# Patient Record
Sex: Male | Born: 1965 | Race: Black or African American | Hispanic: No | Marital: Single | Smoking: Never smoker
Health system: Southern US, Community
[De-identification: ages and names within clinical notes are randomized; demographics above are authoritative.]

## PROBLEM LIST (undated history)

## (undated) DIAGNOSIS — E119 Type 2 diabetes mellitus without complications: Secondary | ICD-10-CM

## (undated) DIAGNOSIS — I10 Essential (primary) hypertension: Secondary | ICD-10-CM

## (undated) DIAGNOSIS — R000 Tachycardia, unspecified: Secondary | ICD-10-CM

## (undated) DIAGNOSIS — R0602 Shortness of breath: Secondary | ICD-10-CM

## (undated) HISTORY — DX: Tachycardia, unspecified: R00.0

## (undated) HISTORY — DX: Essential (primary) hypertension: I10

## (undated) HISTORY — DX: Shortness of breath: R06.02

---

## 2007-05-18 ENCOUNTER — Ambulatory Visit: Payer: Self-pay | Admitting: Emergency Medicine

## 2007-08-21 ENCOUNTER — Ambulatory Visit: Payer: Self-pay | Admitting: Internal Medicine

## 2007-09-17 ENCOUNTER — Ambulatory Visit: Payer: Self-pay | Admitting: Emergency Medicine

## 2007-10-10 ENCOUNTER — Ambulatory Visit: Payer: Self-pay | Admitting: Family Medicine

## 2007-12-28 ENCOUNTER — Ambulatory Visit: Payer: Self-pay | Admitting: Family Medicine

## 2008-05-06 ENCOUNTER — Ambulatory Visit: Payer: Self-pay | Admitting: Family Medicine

## 2008-05-14 ENCOUNTER — Ambulatory Visit: Payer: Self-pay | Admitting: Family Medicine

## 2008-06-18 ENCOUNTER — Ambulatory Visit: Payer: Self-pay | Admitting: Family Medicine

## 2008-08-28 ENCOUNTER — Ambulatory Visit: Payer: Self-pay | Admitting: Family Medicine

## 2008-11-10 ENCOUNTER — Ambulatory Visit: Payer: Self-pay | Admitting: Internal Medicine

## 2008-12-02 ENCOUNTER — Ambulatory Visit: Payer: Self-pay | Admitting: Internal Medicine

## 2009-03-21 ENCOUNTER — Ambulatory Visit: Payer: Self-pay | Admitting: Internal Medicine

## 2009-06-03 ENCOUNTER — Ambulatory Visit: Payer: Self-pay | Admitting: Family Medicine

## 2009-08-03 ENCOUNTER — Ambulatory Visit: Payer: Self-pay | Admitting: Family Medicine

## 2010-02-21 ENCOUNTER — Emergency Department: Payer: Self-pay | Admitting: Emergency Medicine

## 2010-02-26 ENCOUNTER — Encounter: Payer: Self-pay | Admitting: Cardiovascular Disease

## 2010-03-05 ENCOUNTER — Ambulatory Visit: Payer: Self-pay | Admitting: Cardiovascular Disease

## 2010-03-05 DIAGNOSIS — R0989 Other specified symptoms and signs involving the circulatory and respiratory systems: Secondary | ICD-10-CM | POA: Insufficient documentation

## 2010-03-05 DIAGNOSIS — R0602 Shortness of breath: Secondary | ICD-10-CM | POA: Insufficient documentation

## 2010-03-05 DIAGNOSIS — I10 Essential (primary) hypertension: Secondary | ICD-10-CM | POA: Insufficient documentation

## 2010-03-05 DIAGNOSIS — R079 Chest pain, unspecified: Secondary | ICD-10-CM | POA: Insufficient documentation

## 2010-03-05 DIAGNOSIS — I498 Other specified cardiac arrhythmias: Secondary | ICD-10-CM

## 2010-03-09 ENCOUNTER — Telehealth: Payer: Self-pay | Admitting: Cardiovascular Disease

## 2010-03-15 ENCOUNTER — Ambulatory Visit: Payer: Self-pay | Admitting: Internal Medicine

## 2010-03-15 ENCOUNTER — Encounter: Payer: Self-pay | Admitting: Cardiovascular Disease

## 2010-03-16 ENCOUNTER — Ambulatory Visit: Payer: Self-pay

## 2010-03-16 ENCOUNTER — Encounter: Payer: Self-pay | Admitting: Internal Medicine

## 2010-03-16 ENCOUNTER — Encounter: Payer: Self-pay | Admitting: Cardiovascular Disease

## 2010-03-16 LAB — CONVERTED CEMR LAB
ALT: 79 units/L — ABNORMAL HIGH (ref 0–53)
Albumin: 5.1 g/dL (ref 3.5–5.2)
TSH: 1.766 microintl units/mL (ref 0.350–4.500)
Total Protein: 8.1 g/dL (ref 6.0–8.3)

## 2010-03-19 ENCOUNTER — Encounter: Payer: Self-pay | Admitting: Cardiovascular Disease

## 2010-03-19 ENCOUNTER — Ambulatory Visit: Payer: Self-pay | Admitting: Internal Medicine

## 2010-03-22 LAB — CONVERTED CEMR LAB
BUN: 11 mg/dL (ref 6–23)
Eosinophils Absolute: 0.2 10*3/uL (ref 0.0–0.7)
INR: 1.11 (ref <1.50)
Lymphocytes Relative: 21 % (ref 12–46)
Lymphs Abs: 1.8 10*3/uL (ref 0.7–4.0)
MCV: 94.8 fL (ref 78.0–100.0)
Neutro Abs: 6.3 10*3/uL (ref 1.7–7.7)
Neutrophils Relative %: 72 % (ref 43–77)
Platelets: 180 10*3/uL (ref 150–400)
Potassium: 4.1 meq/L (ref 3.5–5.3)
Prothrombin Time: 14.2 s (ref 11.6–15.2)
WBC: 8.7 10*3/uL (ref 4.0–10.5)
aPTT: 31 s

## 2010-03-24 ENCOUNTER — Ambulatory Visit: Payer: Self-pay | Admitting: Cardiovascular Disease

## 2010-04-12 ENCOUNTER — Emergency Department: Payer: Self-pay | Admitting: Emergency Medicine

## 2010-05-14 ENCOUNTER — Ambulatory Visit: Payer: Self-pay | Admitting: Orthopedic Surgery

## 2010-07-01 ENCOUNTER — Encounter: Payer: Self-pay | Admitting: Cardiovascular Disease

## 2010-07-31 ENCOUNTER — Encounter: Payer: Self-pay | Admitting: Cardiovascular Disease

## 2010-09-28 NOTE — Cardiovascular Report (Signed)
Summary: Cardiac Cath Other  Cardiac Cath Other   Imported By: West Carbo 03/25/2010 11:02:03  _____________________________________________________________________  External Attachment:    Type:   Image     Comment:   External Document

## 2010-09-28 NOTE — Letter (Signed)
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 [address]   [place], [place] [address]   Phone: [phone]  Fax: [phone]    03/19/2010  TO: Leodis Sias IT MAY CONCERN   RE: [place] P[place]TA [address] [id number]   The above named individual is under my medical care and may return to work after his procedure on July 27,2011: August 1,2011.  If you have any further questions or need additional information, please call.     Sincerely,      Tim Gollan,MD/Ph.D

## 2010-09-28 NOTE — Letter (Signed)
Summary: Disability  Disability   Imported By: Harlon Flor 08/06/2010 15:41:15  _____________________________________________________________________  External Attachment:    Type:   Image     Comment:   External Document

## 2010-09-28 NOTE — Progress Notes (Signed)
Summary: Holter monitor  Phone Note Outgoing Call   Call placed by: Benedict Needy, RN,  March 09, 2010 3:34 PM Call placed to: Patient Summary of Call: Pt had holter placed on 03/05/10 supposed to bring back on 03/08/10.  Attempted to call several times since monday pt has not returned any of the phone calls.  Pt has appointment with Dr. Ladona Ridgel 03/10/10. Dr. Mariah Milling aware that pt is not returning phone calls.  Initial call taken by: Benedict Needy, RN,  March 09, 2010 3:36 PM  Follow-up for Phone Call        The pt came in today for his office visit with Dr. Ladona Ridgel. He brought his heart monitor with him. We explained to the pt our attempts to reach him to bring his monitor back prior to the appt. with Dr. Ladona Ridgel today. Discussed the situation with Dr. Ladona Ridgel. He prefers the pt r/s unless he absolutely wants to be seen today. The pt has been r/s to monday 7/18 with Dr. Graciela Husbands. Follow-up by: Sherri Rad, RN, BSN,  March 10, 2010 5:49 PM

## 2010-09-28 NOTE — Assessment & Plan Note (Signed)
Summary: NEW EP   History of Present Illness: patient did not bring records.  Allergies: No Known Drug Allergies

## 2010-09-28 NOTE — Letter (Signed)
Summary: [facility] NOTE  [facility] NOTE   Imported By: Frazier Butt Chriscoe 03/08/2010 10:15:00  _____________________________________________________________________  External Attachment:    Type:   Image     Comment:   External Document

## 2010-09-28 NOTE — Letter (Signed)
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 [id number]   The above named individual is under my medical care and may return to work on:March 15, 2010  If you have any further questions or need additional information, please call.     Sincerely,      Dossie Arbour MD

## 2010-09-28 NOTE — Assessment & Plan Note (Signed)
Summary: NEW EP   Visit Type:  New EP Primary Provider:  Dr. Leeanne Mannan  CC:  chest pain...sob...palpatations...edema/hands.  History of Present Illness: Luis Perry is seen at the request of Dr. Mariah Milling because of persistence of what appears to be sinus tachycardia.  this apparently was noted in 2004 when he left the [employer]. He remembers the term "accelerated heart rate". At that time his blood pressure was normal.  About a year ago he started noticing when checking his blood pressure that his heart rate was elevated at the same time his blood pressure was elevated. Over the last 6 months, he has had more problems with exercise intolerance manifested by exertional chest discomfort with radiation to his neck and his left arm accompanied by shortness of breath diaphoresis nausea. He has also had shortness of breath apart from the above as well as some nocturnal dyspnea and two-pillow orthopnea. He has occasional peripheral edema.  Over the last number of months 2 he is noted palpitations with exertion for the first time. It is his impression that the rapid heart rate and the chest discomfort and shortness of breath although together. He can occasionally accompanied by lightheadedness. There has been no syncope.  Cardiac risk factors are notable for hypertension family history; his lipid status is not known    in 2009 transaminases were elevated. No TSH or ECHO cardiogram is in the record. Other laboratories are normal.  Current Medications (verified): 1)  Multivitamins   Tabs (Multiple Vitamin) .... Once Daily 2)  Metoprolol Tartrate 100 Mg Tabs (Metoprolol Tartrate) .... Take One Tablet By Mouth Twice A Day  Allergies (verified): No Known Drug Allergies  Past History:  Past Medical History: Last updated: 03/05/2010 SOB Tachycardia Chest Pain Hypertension  Past Surgical History: Last updated: 03/05/2010 none  Vital Signs:  Patient profile:   45 year old male Height:      76  inches Weight:      270 pounds BMI:     32.98 Pulse rate:   124 / minute Pulse rhythm:   irregular BP sitting:   159 / 123  (left arm) Cuff size:   large  Vitals Entered By: Danielle Rankin, CMA (March 15, 2010 2:18 PM)  Physical Exam  General:  Well developed, well nourished African American male appearing his stated age, in no acute distress. Head:  normal HEENT Neck:  supple without thyromegaly; neck veins appear to be 9-10 cm although he has very bull neck Chest Wall:  without CVA tenderness Lungs:  clear to auscultation Heart:  regular rate and rhythm without significant murmurs Abdomen:  soft nontender without hepatomegaly midline pulsation; there is no HJR Msk:  Back normal, normal gait. Muscle strength and tone normal. Pulses:  intact distal pulses albeit  thready Extremities:  No clubbing or cyanosis. Neurologic:  Alert and oriented x 3. grossly normal motor and sensory function Skin:  mildly diaphoretic but without rashes Cervical Nodes:  no lymph nodes Psych:  normal affect   EKG  Procedure date:  03/15/2010  Findings:      supraventricular probably sinus rhythm at a rate of 124 Intervals 0.15/2106.32 with a QTC of 0.46 axis is leftward at -39  Impression & Recommendations:  Problem # 1:  TACHYCARDIA (ICD-785)  Long standing tachycardia it sounds like back to 2004.  The mechanism is not clear sinus v atrial, ie primary v secondary.  Holter monitoring may give [place] some clue if there is little heart rate variability. Othersecondary triggersneed to be  excluded. This would include a TSH and a urine collection for metanephrines.  I have been impressed at the lack of impact on his tachycardia by the beta blockers which might suggest that is more autonomous and the sinus node. As we pursue our evaluation, I have explained to the patient that we may end up coming to electrophysiological testing for clarification of the mechanism and potential catheter ablation course of  therapy.  For right now we will need to check whether the urine studies need to be done on or off beta blockers  Problem # 2:  DYSPNEA (ICD-786.05)  I am impressed at the progressive nature of his dyspnea. He has some evidence of right heart pressure overload. It is possible that the tachycardia is contributing to this and evaluation of his myocardial performance both structurally as well as by perfusion analysis will be imperative. Given the fact that he has evidence of right heart pressure overload, I discussed with him the value of right and left heart catheterization versus noninvasive imaging. He is agreeable to proceeding.  Given his size and his hypertension, I think we also need to consider the potential contributing role of obstructive sleep apnea. The patient has daytime somnolence. He lives by himself and doesn't know how much he snores. Outpatient sleep study will be potentially beneficial particularly if we see elevated pulmonary pressures  Orders: T-Hepatic Function ([id number])  Problem # 3:  CHEST PAIN-UNSPECIFIED (ICD-786.50) Assessment: Unchanged  his chest pain  is worrisome particularly because of the associated epiphenomena not within the fact that has some atypical symptoms particularly the long recovery period.  Given the issues of elevated palmar pressures, I would favor proceeding with catheterization as opposed to noninvasive imaging as outlined above. We also need to check his fasting lipid profile.  Orders: Cardiac Catheterization (Cardiac Cath) Echocardiogram (Echo)  Problem # 4:  HYPERTENSION, UNSPECIFIED (ICD-401.9) his blood pressure is poorly controlled. Will add an ACE inhibitor to his regime today. Renal function was normal at [facility] last month.  Other Orders: T-TSH [id number]) T-Urine 24 Hr. Metanephrines [id number])  Patient Instructions: 1)  Your physician has requested that you have an echocardiogram.  Echocardiography is a painless test that  uses sound waves to create images of your heart. It provides your doctor with information about the size and shape of your heart and how well your heart's chambers and valves are working.  This procedure takes approximately one hour. There are no restrictions for this procedure. 2)  Your physician has recommended that you wear a holter monitor.  Holter monitors are medical devices that record the heart's electrical activity. Doctors most often use these monitors to diagnose arrhythmias. Arrhythmias are problems with the speed or rhythm of the heartbeat. The monitor is a small, portable device. You can wear one while you do your normal daily activities. This is usually used to diagnose what is causing palpitations/syncope (passing out). 3)  Your physician recommends that you continue on your current medications as directed. Please refer to the Current Medication list given to you today. 4)  Your physician has recommended you make the following change in your medication: Add Lisinopri HCTl 10/12.5 mg one tablet daily. 5)  Your physician has requested that you have a cardiac catheterization.  Cardiac catheterization is used to diagnose and/or treat various heart conditions. Doctors may recommend this procedure for a number of different reasons. The most common reason is to evaluate chest pain. Chest pain can be a symptom of coronary artery disease (CAD),  and cardiac catheterization can show whether plaque is narrowing or blocking your heart's arteries. This procedure is also used to evaluate the valves, as well as measure the blood flow and oxygen levels in different parts of your heart.  For further information please visit [email or web address]  Please follow instruction sheet, as given. Prescriptions: LISINOPRIL-HYDROCHLOROTHIAZIDE 10-12.5 MG TABS (LISINOPRIL-HYDROCHLOROTHIAZIDE) one tablet everyday  #30 x 1   Entered by:   Bishop Dublin, CMA   Authorized by:   Nathen May, MD, [facility]   Signed by:    Bishop Dublin, CMA on 03/15/2010   Method used:   [facility]        [facility] [phone][facility] (retail)       [address]       [place], [place]  [address]       Ph: [phone] or [phone]       Fax: [phone]   RxID:   [id number]

## 2010-09-28 NOTE — Letter (Signed)
Summary: Cardiac Catheterization Instructions- Main Lab  [facility] at [address]   [place], [place] [address]   Phone: [phone]  Fax: [phone]     03/16/2010 MRN: [id number]  [facility] [address] [place], [place]  [address]  Dear Mr. Kath,   You are scheduled for Cardiac Catheterization on July 27 at 8 am              with Dr. Mariah Milling.  Please arrive at the [facility] at 7 a.m. on the day of your procedure.  1. DIET     __x__ Nothing to eat or drink after midnight except your medications with a sip of water.  2. Come to the [facility] office on July 22 at 2:30 for lab work.    3. MAKE SURE YOU TAKE YOUR ASPIRIN.  4.___x_ YOU MAY TAKE ALL of your remaining medications with a small amount of water.   5. Plan for one night stay - bring personal belongings (i.e. toothpaste, toothbrush, etc.)  6. Bring a current list of your medications and current insurance cards.  7. Must have a responsible person to drive you home.   8. Someone must be with yu for the first 24 hours after you arrive home.  9. Please wear clothes that are easy to get on and off and wear slip-on shoes.  *Special note: Every effort is made to have your procedure done on time.  Occasionally there are emergencies that present themselves at the hospital that may cause delays.  Please be patient if a delay does occur.  If you have any questions after you get home, please call the office at the number listed above.  Benedict Needy, RN

## 2010-09-28 NOTE — Miscellaneous (Signed)
Summary: Cardiac cath  Clinical Lists Changes  Orders: Added new Referral order of Cardiac Catheterization (Cardiac Cath) - Signed

## 2010-09-28 NOTE — Assessment & Plan Note (Signed)
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 He was diagnosed with sinus tachycardia, had negative cardiac enzymes and was discharged home. He has been managed on atenolol 25 mg b.i.d. for hypertension. He has missed several cardiology appointments to do his work schedule.  Today, his main complaint is pounding in his chest, with associated shortness of breath. He reports that it starts at 6 in the morning and goes all day long. He has had worsening symptoms over the past several months. 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 He works a 12 hour work day and has pounding heart rates through this entire period though he does report it seems to get a little bit better toward the end of the day. He denies significant chest pain though this is occasional such as when he went to the emergency room for his pain symptom is the heart pounding.  EKG from March 25 shows sinus tachycardia with a rate of 104 beats per minute EKG from June 13 shows sinus tachycardia with rate of 112 beats per minute EKG from June 17 shows sinus tachycardia with rate of 113 beats per minute EKG from June 26 shows sinus tachycardia with a rate of 123 beats a minute. This was done in the emergency room.  EKG today shows sinus tachycardia with rate 126 beats per minute. Left axis deviation, no other significant ST or T wave changes.           Preventive Screening-Counseling & Management  Alcohol-Tobacco     Smoking Status: never  Caffeine-Diet-Exercise     Does Patient Exercise: yes      Drug Use:  no.    Current Medications (verified): 1)  Atenolol 25 Mg Tabs (Atenolol) .... Take One Tablet By Mouth Daily 2)  Multivitamins   Tabs (Multiple Vitamin) .... 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  Alcohol Use - yes -- maybe 6 pack a week Regular Exercise - yes -- walking  Drug Use - no Smoking Status:  never Does Patient Exercise:  yes Drug Use:  no  Review of Systems       The patient complains of dyspnea on exertion.  The patient denies fever, weight loss, weight gain, vision loss, decreased hearing, hoarseness, chest pain, syncope, prolonged cough, abdominal pain, incontinence, muscle weakness, depression, and enlarged lymph nodes.         Heart pounding  Vital Signs:  Patient profile:   45 year old male Height:      76 inches Weight:      272 pounds BMI:     33.23 Pulse rate:   126 / minute BP sitting:   170 / 017  (right arm) Cuff size:   regular  Vitals Entered By: Hardin Negus, RMA (March 05, 2010 2:44 PM)  Physical Exam  General:  Well developed, well nourished, in no acute distress. Head:  normocephalic and atraumatic Neck:  Neck supple, no JVD. No masses,  thyromegaly or abnormal cervical nodes. Chest Wall:  no deformities or breast masses noted Lungs:  Clear bilaterally to auscultation and percussion. Heart:  Non-displaced PMI, chest non-tender; tachycardia, regular, S1, S2 without murmurs, rubs or gallops. Carotid upstroke normal, no bruit.  Pedals normal pulses. No edema, no varicosities. Abdomen:  Bowel sounds positive; abdomen soft and non-tender without masses Msk:  Back normal, normal gait. Muscle strength and tone normal. Pulses:  pulses normal in all 4 extremities Extremities:  No clubbing or cyanosis. Neurologic:  Alert and oriented x 3. Skin:  Intact without lesions or rashes. Psych:  Normal affect.  Slightly anxious   Problems:  Medical Problems Added: 1)  Dx of Tachycardia  (ICD-785) 2)  Dx of Atrial Tachycardia  (ICD-427.89) 3)  Dx of Dyspnea  (ICD-786.05) 4)  Dx of Chest Pain-unspecified  (ICD-786.50) 5)  Dx of Hypertension, Unspecified  (ICD-401.9)  Impression & Recommendations:  Problem # 1:  TACHYCARDIA (ICD-785) the case was discussed with Dr.Klein. We will perform a 48-hour monitor to determine if he has consistently elevated heart rates even at rest. The concern is that he might have an atrial tachycardia that might be best served with an ablation. If he does have significantly improved her rates at nighttime with rest, potentially this could be managed medically.  We have given him metoprolol heart rate 100 mg b.i.d. to be started after his monitor has been completed. He will followup with either myself or one of the electrophysiology physicians to review his monitor, and whether he is a candidate for a procedure or whether this can be managed medically.  We have excused him from work next week or week prior to determine his underlying rhythm. He appears anxious, mildly uncomfortable from his underlying fast rhythm which likely increases in rate at work with minimal exertion.  he may need an echocardiogram if he does not have significant improvement in his rate and symptoms to rule out a tachycardia mediated cardiomyopathy.  Other Orders: EKG w/ Interpretation (93000) Holter Monitor (Holter Monitor)  Patient Instructions: 1)  Your physician has recommended you make the following change in your medication: After you are done with monitor START metoprolol 100mg  two times a day STOP Atenolol 2)  Your physician has recommended that you wear a holter monitor.  Holter monitors are medical devices that record the heart's electrical activity. Doctors most often use these monitors to diagnose arrhythmias. Arrhythmias are problems with the speed or rhythm of the heartbeat. The  monitor is a small, portable device. You can wear one while you do your normal daily activities. This is usually used to diagnose what is causing palpitations/syncope (passing out). 3)  Your physician recommends that you schedule a follow-up appointment in: Dr. Lenon Oms July 13 at 3:30 Prescriptions: METOPROLOL TARTRATE 100 MG TABS (METOPROLOL TARTRATE) Take one tablet by mouth twice a day  #60 x 4   Entered by:   Benedict Needy, RN   Authorized by:   Dossie Arbour MD   Signed by:   Benedict Needy, RN on 03/05/2010   Method used:   [facility]  [address] [phone][facility] (retail)       [address]       [place], [place]  [address]       Ph: [phone] or [phone]       Fax: [phone]   RxID:   [id number]

## 2010-09-28 NOTE — Procedures (Signed)
Summary: Holter and Event  Holter and Event   Imported By: Frazier Butt Chriscoe 03/17/2010 14:23:51  _____________________________________________________________________  External Attachment:    Type:   Image     Comment:   External Document

## 2010-09-28 NOTE — Letter (Signed)
Summary: Letter  Letter   Imported By: West Carbo 03/16/2010 07:58:25  _____________________________________________________________________  External Attachment:    Type:   Image     Comment:   External Document

## 2010-11-09 NOTE — Letter (Signed)
Summary: Disability Papers  Disability Papers   Imported By: Harlon Flor 11/03/2010 10:49:55  _____________________________________________________________________  External Attachment:    Type:   Image     Comment:   External Document

## 2011-03-07 ENCOUNTER — Encounter: Payer: Self-pay | Admitting: Cardiovascular Disease

## 2015-05-13 ENCOUNTER — Encounter: Payer: Self-pay | Admitting: Internal Medicine

## 2015-05-13 ENCOUNTER — Inpatient Hospital Stay: Payer: Medicaid Other

## 2015-05-13 ENCOUNTER — Emergency Department: Payer: Medicaid Other

## 2015-05-13 ENCOUNTER — Inpatient Hospital Stay
Admission: EM | Admit: 2015-05-13 | Discharge: 2015-05-30 | DRG: 871 | Disposition: E | Payer: Medicaid Other | Attending: Internal Medicine | Admitting: Internal Medicine

## 2015-05-13 DIAGNOSIS — E873 Alkalosis: Secondary | ICD-10-CM | POA: Diagnosis present

## 2015-05-13 DIAGNOSIS — R739 Hyperglycemia, unspecified: Secondary | ICD-10-CM | POA: Diagnosis present

## 2015-05-13 DIAGNOSIS — K567 Ileus, unspecified: Secondary | ICD-10-CM | POA: Diagnosis not present

## 2015-05-13 DIAGNOSIS — E876 Hypokalemia: Secondary | ICD-10-CM | POA: Diagnosis present

## 2015-05-13 DIAGNOSIS — E870 Hyperosmolality and hypernatremia: Secondary | ICD-10-CM | POA: Diagnosis present

## 2015-05-13 DIAGNOSIS — Z452 Encounter for adjustment and management of vascular access device: Secondary | ICD-10-CM

## 2015-05-13 DIAGNOSIS — D65 Disseminated intravascular coagulation [defibrination syndrome]: Secondary | ICD-10-CM | POA: Diagnosis not present

## 2015-05-13 DIAGNOSIS — E1111 Type 2 diabetes mellitus with ketoacidosis with coma: Secondary | ICD-10-CM

## 2015-05-13 DIAGNOSIS — G936 Cerebral edema: Secondary | ICD-10-CM | POA: Diagnosis present

## 2015-05-13 DIAGNOSIS — R6521 Severe sepsis with septic shock: Secondary | ICD-10-CM | POA: Diagnosis present

## 2015-05-13 DIAGNOSIS — E1311 Other specified diabetes mellitus with ketoacidosis with coma: Secondary | ICD-10-CM | POA: Diagnosis present

## 2015-05-13 DIAGNOSIS — J9601 Acute respiratory failure with hypoxia: Secondary | ICD-10-CM | POA: Diagnosis present

## 2015-05-13 DIAGNOSIS — Z8249 Family history of ischemic heart disease and other diseases of the circulatory system: Secondary | ICD-10-CM | POA: Diagnosis not present

## 2015-05-13 DIAGNOSIS — Z96641 Presence of right artificial hip joint: Secondary | ICD-10-CM | POA: Diagnosis present

## 2015-05-13 DIAGNOSIS — R253 Fasciculation: Secondary | ICD-10-CM | POA: Diagnosis present

## 2015-05-13 DIAGNOSIS — R680 Hypothermia, not associated with low environmental temperature: Secondary | ICD-10-CM | POA: Diagnosis present

## 2015-05-13 DIAGNOSIS — E1110 Type 2 diabetes mellitus with ketoacidosis without coma: Secondary | ICD-10-CM | POA: Diagnosis present

## 2015-05-13 DIAGNOSIS — R001 Bradycardia, unspecified: Secondary | ICD-10-CM | POA: Diagnosis present

## 2015-05-13 DIAGNOSIS — A419 Sepsis, unspecified organism: Principal | ICD-10-CM | POA: Diagnosis present

## 2015-05-13 DIAGNOSIS — N179 Acute kidney failure, unspecified: Secondary | ICD-10-CM | POA: Insufficient documentation

## 2015-05-13 DIAGNOSIS — E875 Hyperkalemia: Secondary | ICD-10-CM | POA: Diagnosis present

## 2015-05-13 DIAGNOSIS — H5704 Mydriasis: Secondary | ICD-10-CM | POA: Diagnosis present

## 2015-05-13 DIAGNOSIS — Z66 Do not resuscitate: Secondary | ICD-10-CM | POA: Diagnosis present

## 2015-05-13 DIAGNOSIS — E860 Dehydration: Secondary | ICD-10-CM | POA: Diagnosis present

## 2015-05-13 DIAGNOSIS — N170 Acute kidney failure with tubular necrosis: Secondary | ICD-10-CM | POA: Diagnosis present

## 2015-05-13 DIAGNOSIS — J9690 Respiratory failure, unspecified, unspecified whether with hypoxia or hypercapnia: Secondary | ICD-10-CM

## 2015-05-13 DIAGNOSIS — Z833 Family history of diabetes mellitus: Secondary | ICD-10-CM

## 2015-05-13 DIAGNOSIS — I10 Essential (primary) hypertension: Secondary | ICD-10-CM | POA: Diagnosis present

## 2015-05-13 DIAGNOSIS — D689 Coagulation defect, unspecified: Secondary | ICD-10-CM | POA: Diagnosis not present

## 2015-05-13 DIAGNOSIS — G9382 Brain death: Secondary | ICD-10-CM | POA: Diagnosis present

## 2015-05-13 DIAGNOSIS — R34 Anuria and oliguria: Secondary | ICD-10-CM | POA: Diagnosis present

## 2015-05-13 DIAGNOSIS — R0600 Dyspnea, unspecified: Secondary | ICD-10-CM | POA: Insufficient documentation

## 2015-05-13 DIAGNOSIS — R4182 Altered mental status, unspecified: Secondary | ICD-10-CM

## 2015-05-13 DIAGNOSIS — D696 Thrombocytopenia, unspecified: Secondary | ICD-10-CM | POA: Diagnosis present

## 2015-05-13 DIAGNOSIS — G931 Anoxic brain damage, not elsewhere classified: Secondary | ICD-10-CM | POA: Diagnosis present

## 2015-05-13 DIAGNOSIS — I959 Hypotension, unspecified: Secondary | ICD-10-CM

## 2015-05-13 DIAGNOSIS — G9341 Metabolic encephalopathy: Secondary | ICD-10-CM | POA: Diagnosis present

## 2015-05-13 DIAGNOSIS — Z4659 Encounter for fitting and adjustment of other gastrointestinal appliance and device: Secondary | ICD-10-CM

## 2015-05-13 DIAGNOSIS — Z789 Other specified health status: Secondary | ICD-10-CM

## 2015-05-13 HISTORY — DX: Type 2 diabetes mellitus without complications: E11.9

## 2015-05-13 LAB — COMPREHENSIVE METABOLIC PANEL
ALBUMIN: 4.6 g/dL (ref 3.5–5.0)
ALT: 22 U/L (ref 17–63)
ANION GAP: 36 — AB (ref 5–15)
AST: 52 U/L — ABNORMAL HIGH (ref 15–41)
Alkaline Phosphatase: 149 U/L — ABNORMAL HIGH (ref 38–126)
BUN: 31 mg/dL — ABNORMAL HIGH (ref 6–20)
CHLORIDE: 99 mmol/L — AB (ref 101–111)
CO2: 6 mmol/L — AB (ref 22–32)
Calcium: 9.6 mg/dL (ref 8.9–10.3)
Creatinine, Ser: 3.78 mg/dL — ABNORMAL HIGH (ref 0.61–1.24)
GFR calc Af Amer: 20 mL/min — ABNORMAL LOW (ref >60)
GFR calc non Af Amer: 17 mL/min — ABNORMAL LOW (ref >60)
GLUCOSE: 981 mg/dL — AB (ref 65–99)
POTASSIUM: 5.2 mmol/L — AB (ref 3.5–5.1)
SODIUM: 141 mmol/L (ref 135–145)
Total Bilirubin: 1.3 mg/dL — ABNORMAL HIGH (ref 0.3–1.2)
Total Protein: 8.5 g/dL — ABNORMAL HIGH (ref 6.5–8.1)

## 2015-05-13 LAB — BLOOD GAS, VENOUS
FIO2: 100
MECHVT: 550 mL
PEEP/CPAP: 5 cmH2O
Patient temperature: 37
RATE: 16 resp/min
pH, Ven: 6.47 — CL (ref 7.320–7.430)

## 2015-05-13 LAB — URINALYSIS COMPLETE WITH MICROSCOPIC (ARMC ONLY)
Bilirubin Urine: NEGATIVE
Glucose, UA: >500 mg/dL — AB
Leukocytes, UA: NEGATIVE
Nitrite: NEGATIVE
PROTEIN: 100 mg/dL — AB
Specific Gravity, Urine: 1.018 (ref 1.005–1.030)
pH: 5 (ref 5.0–8.0)

## 2015-05-13 LAB — CBC WITH DIFFERENTIAL/PLATELET
BAND NEUTROPHILS: 9 %
BASOS PCT: 1 %
Basophils Absolute: 0.3 10*3/uL — ABNORMAL HIGH (ref 0–0.1)
Blasts: 0 %
EOS ABS: 0 10*3/uL (ref 0–0.7)
Eosinophils Relative: 0 %
HCT: 48.8 % (ref 40.0–52.0)
Hemoglobin: 13.8 g/dL (ref 13.0–18.0)
LYMPHS PCT: 22 %
Lymphs Abs: 6.2 10*3/uL — ABNORMAL HIGH (ref 1.0–3.6)
MCH: 29.8 pg (ref 26.0–34.0)
MCHC: 28.2 g/dL — AB (ref 32.0–36.0)
MCV: 105.7 fL — ABNORMAL HIGH (ref 80.0–100.0)
METAMYELOCYTES PCT: 1 %
MONO ABS: 1.7 10*3/uL — AB (ref 0.2–1.0)
MONOS PCT: 6 %
Myelocytes: 0 %
NEUTROS ABS: 20.2 10*3/uL — AB (ref 1.4–6.5)
Neutrophils Relative %: 61 %
OTHER: 0 %
PROMYELOCYTES ABS: 0 %
Platelets: 160 10*3/uL (ref 150–440)
RBC: 4.62 MIL/uL (ref 4.40–5.90)
RDW: 15.5 % — AB (ref 11.5–14.5)
WBC: 28.4 10*3/uL — ABNORMAL HIGH (ref 3.8–10.6)
nRBC: 0 /100 WBC

## 2015-05-13 LAB — PROTIME-INR
INR: 1.57
INR: 1.96
PROTHROMBIN TIME: 19 s — AB (ref 11.4–15.0)
PROTHROMBIN TIME: 22.5 s — AB (ref 11.4–15.0)

## 2015-05-13 LAB — URINE DRUG SCREEN, QUALITATIVE (ARMC ONLY)
AMPHETAMINES, UR SCREEN: NOT DETECTED
Barbiturates, Ur Screen: NOT DETECTED
Benzodiazepine, Ur Scrn: NOT DETECTED
Cannabinoid 50 Ng, Ur ~~LOC~~: NOT DETECTED
Cocaine Metabolite,Ur ~~LOC~~: NOT DETECTED
MDMA (ECSTASY) UR SCREEN: NOT DETECTED
Methadone Scn, Ur: NOT DETECTED
Opiate, Ur Screen: NOT DETECTED
PHENCYCLIDINE (PCP) UR S: NOT DETECTED
TRICYCLIC, UR SCREEN: NOT DETECTED

## 2015-05-13 LAB — ETHANOL

## 2015-05-13 LAB — TYPE AND SCREEN
ABO/RH(D): O POS
Antibody Screen: NEGATIVE

## 2015-05-13 LAB — GLUCOSE, CAPILLARY
GLUCOSE-CAPILLARY: 517 mg/dL — AB (ref 65–99)
GLUCOSE-CAPILLARY: 558 mg/dL — AB (ref 65–99)
GLUCOSE-CAPILLARY: 588 mg/dL — AB (ref 65–99)
GLUCOSE-CAPILLARY: 481 mg/dL — AB (ref 65–99)
Glucose-Capillary: >600 mg/dL (ref 65–99)
Glucose-Capillary: 580 mg/dL (ref 65–99)

## 2015-05-13 LAB — BLOOD GAS, ARTERIAL
FIO2: 0.6
Mechanical Rate: 16
PATIENT TEMPERATURE: 37
PCO2 ART: 38 mmHg (ref 32.0–48.0)
PEEP: 8 cmH2O
PH ART: 6.59 — AB (ref 7.350–7.450)
PO2 ART: 266 mmHg — AB (ref 83.0–108.0)
RATE: 16 resp/min
VT: 550 mL

## 2015-05-13 LAB — TROPONIN I: Troponin I: <0.03 ng/mL (ref <0.031)

## 2015-05-13 LAB — ABO/RH: ABO/RH(D): O POS

## 2015-05-13 LAB — LACTIC ACID, PLASMA
LACTIC ACID, VENOUS: 15.2 mmol/L — AB (ref 0.5–2.0)
LACTIC ACID, VENOUS: 17.4 mmol/L — AB (ref 0.5–2.0)
Lactic Acid, Venous: 14.9 mmol/L (ref 0.5–2.0)
Lactic Acid, Venous: 14.2 mmol/L (ref 0.5–2.0)

## 2015-05-13 LAB — PROCALCITONIN: PROCALCITONIN: 0.77 ng/mL

## 2015-05-13 LAB — AMMONIA: Ammonia: 283 umol/L — ABNORMAL HIGH (ref 9–35)

## 2015-05-13 LAB — APTT: aPTT: 62 seconds — ABNORMAL HIGH (ref 24–36)

## 2015-05-13 MED ORDER — DOPAMINE-DEXTROSE 3.2-5 MG/ML-% IV SOLN: 0.0000 ug/kg/min | INTRAVENOUS | Status: DC

## 2015-05-13 MED ORDER — PIPERACILLIN-TAZOBACTAM 3.375 G IVPB
3.3750 g | Freq: Three times a day (TID) | INTRAVENOUS | Status: DC
  Administered 2015-05-13 – 2015-05-15 (×6): 3.375 g via INTRAVENOUS
  Filled 2015-05-13 (×9): qty 50

## 2015-05-13 MED ORDER — SODIUM CHLORIDE 0.9 % IV SOLN
Freq: Once | INTRAVENOUS | Status: AC
  Administered 2015-05-13: 15:00:00 via INTRAVENOUS

## 2015-05-13 MED ORDER — DEXTROSE-NACL 5-0.45 % IV SOLN: INTRAVENOUS | Status: DC

## 2015-05-13 MED ORDER — SODIUM CHLORIDE 0.9 % IV SOLN
INTRAVENOUS | Status: DC
Start: 2015-05-13 — End: 2015-05-15

## 2015-05-13 MED ORDER — MIDAZOLAM HCL 2 MG/2ML IJ SOLN: 2.0000 mg | INTRAMUSCULAR | Status: DC | PRN

## 2015-05-13 MED ORDER — SODIUM POLYSTYRENE SULFONATE 15 GM/60ML PO SUSP
30.0000 g | Freq: Once | ORAL | Status: AC
  Administered 2015-05-13: 30 g via ORAL
  Filled 2015-05-13: qty 120

## 2015-05-13 MED ORDER — FENTANYL BOLUS VIA INFUSION
50.0000 ug | INTRAVENOUS | Status: DC | PRN
  Filled 2015-05-13: qty 50

## 2015-05-13 MED ORDER — SODIUM CHLORIDE 0.9 % IV SOLN
100.0000 mg | Freq: Every day | INTRAVENOUS | Status: DC
  Administered 2015-05-13 – 2015-05-14 (×2): 100 mg via INTRAVENOUS
  Filled 2015-05-13 (×4): qty 100

## 2015-05-13 MED ORDER — POTASSIUM CHLORIDE 10 MEQ/100ML IV SOLN
10.0000 meq | INTRAVENOUS | Status: DC
Start: 2015-05-13 — End: 2015-05-13

## 2015-05-13 MED ORDER — FENTANYL CITRATE (PF) 100 MCG/2ML IJ SOLN
100.0000 ug | INTRAMUSCULAR | Status: DC | PRN
  Administered 2015-05-14: 100 ug via INTRAVENOUS
  Filled 2015-05-13: qty 2

## 2015-05-13 MED ORDER — SODIUM BICARBONATE 8.4 % IV SOLN
INTRAVENOUS | Status: AC
  Administered 2015-05-14: 50 meq
  Filled 2015-05-13: qty 50

## 2015-05-13 MED ORDER — DOCUSATE SODIUM 100 MG PO CAPS: 100.0000 mg | ORAL_CAPSULE | Freq: Two times a day (BID) | ORAL | Status: DC | PRN

## 2015-05-13 MED ORDER — NOREPINEPHRINE 4 MG/250ML-% IV SOLN
INTRAVENOUS | Status: AC
  Filled 2015-05-13: qty 250

## 2015-05-13 MED ORDER — SODIUM CHLORIDE 0.9 % IV SOLN: 200.0000 mg | INTRAVENOUS | Status: DC

## 2015-05-13 MED ORDER — CALCIUM GLUCONATE 10 % IV SOLN
1.0000 g | Freq: Once | INTRAVENOUS | Status: AC
  Administered 2015-05-13: 1 g via INTRAVENOUS
  Filled 2015-05-13: qty 10

## 2015-05-13 MED ORDER — ENOXAPARIN SODIUM 30 MG/0.3ML ~~LOC~~ SOLN
30.0000 mg | SUBCUTANEOUS | Status: DC
Start: 2015-05-13 — End: 2015-05-14

## 2015-05-13 MED ORDER — DOPAMINE-DEXTROSE 3.2-5 MG/ML-% IV SOLN
5.0000 ug/kg/min | INTRAVENOUS | Status: DC
  Administered 2015-05-13: 8 ug/kg/min via INTRAVENOUS

## 2015-05-13 MED ORDER — DOPAMINE-DEXTROSE 3.2-5 MG/ML-% IV SOLN
INTRAVENOUS | Status: AC
  Filled 2015-05-13: qty 250

## 2015-05-13 MED ORDER — NOREPINEPHRINE 4 MG/250ML-% IV SOLN: 2.0000 ug/min | Freq: Once | INTRAVENOUS | Status: AC

## 2015-05-13 MED ORDER — LACTATED RINGERS IV BOLUS (SEPSIS)
1000.0000 mL | Freq: Once | INTRAVENOUS | Status: AC
  Administered 2015-05-13: 1000 mL via INTRAVENOUS

## 2015-05-13 MED ORDER — SODIUM CHLORIDE 0.9 % IV SOLN
INTRAVENOUS | Status: AC
  Administered 2015-05-13: 19:00:00 via INTRAVENOUS

## 2015-05-13 MED ORDER — DEXMEDETOMIDINE HCL IN NACL 400 MCG/100ML IV SOLN: 0.0000 ug/kg/h | INTRAVENOUS | Status: DC

## 2015-05-13 MED ORDER — SODIUM CHLORIDE 0.9 % IV SOLN
INTRAVENOUS | Status: DC
Start: 2015-05-13 — End: 2015-05-14
  Administered 2015-05-13 – 2015-05-14 (×3): via INTRAVENOUS

## 2015-05-13 MED ORDER — VANCOMYCIN HCL 10 G IV SOLR
2000.0000 mg | Freq: Once | INTRAVENOUS | Status: AC
  Administered 2015-05-13: 2000 mg via INTRAVENOUS
  Filled 2015-05-13: qty 2000

## 2015-05-13 MED ORDER — INSULIN ASPART 100 UNIT/ML IV SOLN
10.0000 [IU] | Freq: Once | INTRAVENOUS | Status: AC
  Administered 2015-05-13: 10 [IU] via INTRAVENOUS
  Filled 2015-05-13: qty 0.1

## 2015-05-13 MED ORDER — INSULIN REGULAR HUMAN 100 UNIT/ML IJ SOLN: INTRAMUSCULAR | Status: DC

## 2015-05-13 MED ORDER — SODIUM CHLORIDE 0.9 % IV SOLN
INTRAVENOUS | Status: DC
  Administered 2015-05-14: 25.4 [IU]/h via INTRAVENOUS
  Administered 2015-05-14: 50 [IU]/h via INTRAVENOUS
  Administered 2015-05-15: 24.7 [IU]/h via INTRAVENOUS
  Administered 2015-05-15: 17.3 [IU]/h via INTRAVENOUS
  Filled 2015-05-13 (×5): qty 2.5

## 2015-05-13 MED ORDER — SODIUM BICARBONATE 8.4 % IV SOLN
INTRAVENOUS | Status: DC
  Filled 2015-05-13 (×4): qty 150

## 2015-05-13 MED ORDER — SODIUM BICARBONATE 8.4 % IV SOLN
INTRAVENOUS | Status: DC
Start: 2015-05-13 — End: 2015-05-14
  Administered 2015-05-13 – 2015-05-14 (×2): via INTRAVENOUS
  Filled 2015-05-13 (×5): qty 150

## 2015-05-13 MED ORDER — SODIUM BICARBONATE 8.4 % IV SOLN: INTRAVENOUS | Status: DC

## 2015-05-13 MED ORDER — ALBUTEROL SULFATE (2.5 MG/3ML) 0.083% IN NEBU
10.0000 mg | INHALATION_SOLUTION | Freq: Once | RESPIRATORY_TRACT | Status: AC
  Administered 2015-05-13: 10 mg via RESPIRATORY_TRACT
  Filled 2015-05-13: qty 12

## 2015-05-13 MED ORDER — VANCOMYCIN HCL 10 G IV SOLR: 1500.0000 mg | INTRAVENOUS | Status: DC

## 2015-05-13 MED ORDER — LACTATED RINGERS IV SOLN: INTRAVENOUS | Status: DC

## 2015-05-13 MED ORDER — SODIUM CHLORIDE 0.9 % IV SOLN
INTRAVENOUS | Status: DC
  Administered 2015-05-13: 10.8 [IU]/h via INTRAVENOUS
  Filled 2015-05-13: qty 2.5

## 2015-05-13 MED ORDER — SODIUM CHLORIDE 0.9 % IV SOLN
INTRAVENOUS | Status: DC
  Administered 2015-05-13: 17:00:00 via INTRAVENOUS
  Filled 2015-05-13 (×3): qty 50

## 2015-05-13 MED ORDER — ENOXAPARIN SODIUM 40 MG/0.4ML ~~LOC~~ SOLN: 40.0000 mg | SUBCUTANEOUS | Status: DC

## 2015-05-13 MED ORDER — NOREPINEPHRINE 4 MG/250ML-% IV SOLN
0.0000 ug/min | INTRAVENOUS | Status: DC
  Administered 2015-05-13: 10 ug/min via INTRAVENOUS
  Administered 2015-05-14: 90 ug/min via INTRAVENOUS
  Administered 2015-05-14: 100 ug/min via INTRAVENOUS
  Filled 2015-05-13 (×3): qty 250

## 2015-05-13 MED ORDER — SODIUM CHLORIDE 0.9 % IV SOLN: 1000.0000 mL | Freq: Once | INTRAVENOUS | Status: DC

## 2015-05-13 MED ORDER — FENTANYL 2500MCG IN NS 250ML (10MCG/ML) PREMIX INFUSION
25.0000 ug/h | INTRAVENOUS | Status: DC
Start: 2015-05-13 — End: 2015-05-14

## 2015-05-13 MED ORDER — SODIUM BICARBONATE 8.4 % IV SOLN
50.0000 meq | Freq: Once | INTRAVENOUS | Status: AC
Start: 2015-05-13 — End: 2015-05-13
  Administered 2015-05-13: 50 meq via INTRAVENOUS

## 2015-05-13 MED ORDER — ROCURONIUM BROMIDE 50 MG/5ML IV SOLN
80.0000 mg | Freq: Once | INTRAVENOUS | Status: AC
  Administered 2015-05-13: 80 mg via INTRAVENOUS

## 2015-05-13 MED ORDER — HYDROCORTISONE NA SUCCINATE PF 100 MG IJ SOLR
50.0000 mg | Freq: Four times a day (QID) | INTRAMUSCULAR | Status: DC
  Administered 2015-05-14 (×2): 50 mg via INTRAVENOUS
  Filled 2015-05-13 (×2): qty 2

## 2015-05-13 MED ORDER — SODIUM CHLORIDE 0.9 % IV SOLN
2.0000 ug/min | Freq: Once | INTRAVENOUS | Status: DC
  Administered 2015-05-13: 6 ug/min via INTRAVENOUS

## 2015-05-13 MED ORDER — VASOPRESSIN 20 UNIT/ML IV SOLN
0.0300 [IU]/min | INTRAVENOUS | Status: DC
  Administered 2015-05-13: 0.03 [IU]/min via INTRAVENOUS
  Filled 2015-05-13: qty 2

## 2015-05-13 MED ORDER — FENTANYL CITRATE (PF) 100 MCG/2ML IJ SOLN: 50.0000 ug | Freq: Once | INTRAMUSCULAR | Status: DC

## 2015-05-13 MED ORDER — PANTOPRAZOLE SODIUM 40 MG IV SOLR
40.0000 mg | INTRAVENOUS | Status: DC
  Administered 2015-05-13 – 2015-05-14 (×2): 40 mg via INTRAVENOUS
  Filled 2015-05-13 (×2): qty 40

## 2015-05-13 MED ORDER — SODIUM CHLORIDE 0.9 % IV BOLUS (SEPSIS)
1000.0000 mL | INTRAVENOUS | Status: AC
  Administered 2015-05-13 (×3): 1000 mL via INTRAVENOUS

## 2015-05-13 MED ORDER — IOHEXOL 240 MG/ML SOLN: 50.0000 mL | Freq: Once | INTRAMUSCULAR | Status: DC | PRN

## 2015-05-13 MED ORDER — POTASSIUM CHLORIDE 10 MEQ/100ML IV SOLN
10.0000 meq | INTRAVENOUS | Status: AC
  Filled 2015-05-13 (×4): qty 100

## 2015-05-13 MED ORDER — NOREPINEPHRINE 4 MG/250ML-% IV SOLN: 0.0000 ug/min | INTRAVENOUS | Status: DC

## 2015-05-13 MED ORDER — NOREPINEPHRINE BITARTRATE 1 MG/ML IV SOLN: 5.0000 ug/min | INTRAVENOUS | Status: DC

## 2015-05-13 NOTE — ED Notes (Signed)
Pt via EMS unresponsive. EMS reported pt was last seen at 0700. EMS reports no palpable blood pressure and heart rate of 70. EMS blood sugar too high to read on machine.

## 2015-05-13 NOTE — ED Notes (Signed)
Chaplain at bedside with family providing support.

## 2015-05-13 NOTE — Progress Notes (Signed)
Patient follow up evaluation:   -Pt now  on 3 different pressors, blood pressure improved at this level. -Patient noted to be severely acidotic despite treatment with bicarbonate and continued aggressive IV fluid resuscitation. -Neuro examination shows that the patient's pupils are mildly reactive, he has a negative oculocephalic reflex, he has no cough reflex to deep suctioning, the patient has no gag reflex, the patient has no pain response. -The patient has fasciculations seen on the tongue suggestive of anoxic brain injury, possibly with underlying seizure activity.  We'll continue current management, and supportive care. At this point, the patient's chances for survival of meaningful recovery are very poor.

## 2015-05-13 NOTE — Progress Notes (Signed)
ANTIBIOTIC CONSULT NOTE - INITIAL  Pharmacy Consult for vancomycin and piperacillin/tazobactam Indication: Sepsis/septic shock  No Known Allergies  Patient Measurements: Height: 6' (182.9 cm) Weight: 220 lb (99.791 kg) IBW/kg (Calculated) : 77.6 Adjusted Body Weight: 86.6 kg  Vital Signs: Temp: 92.1 F (33.4 C) (09/14 1815) Temp Source: Rectal (09/14 1515) BP: 80/28 mmHg (09/14 1745) Pulse Rate: 80 (09/14 1815) Intake/Output from previous day:   Intake/Output from this shift:    Labs:  Recent Labs  05/17/2015 1504  WBC 28.4*  HGB 13.8  PLT 160  CREATININE 3.78*   Estimated Creatinine Clearance: 28.6 mL/min (by C-G formula based on Cr of 3.78). No results for input(s): VANCOTROUGH, VANCOPEAK, VANCORANDOM, GENTTROUGH, GENTPEAK, GENTRANDOM, TOBRATROUGH, TOBRAPEAK, TOBRARND, AMIKACINPEAK, AMIKACINTROU, AMIKACIN in the last 72 hours.   Microbiology: No results found for this or any previous visit (from the past 720 hour(s)).  Medical History: Past Medical History  Diagnosis Date  . SOB (shortness of breath)   . Tachycardia   . Chest pain   . Hypertension   . Diabetes     Medications:  Anti-infectives    Start     Dose/Rate Route Frequency Ordered Stop   06/12/15 1500  vancomycin (VANCOCIN) 1,500 mg in sodium chloride 0.9 % 500 mL IVPB     1,500 mg 250 mL/hr over 120 Minutes Intravenous Every 48 hours 04/30/2015 1954     05/02/2015 2000  vancomycin (VANCOCIN) 2,000 mg in sodium chloride 0.9 % 500 mL IVPB     2,000 mg 250 mL/hr over 120 Minutes Intravenous  Once 05/21/2015 1929     05/21/2015 1930  piperacillin-tazobactam (ZOSYN) IVPB 3.375 g     3.375 g 12.5 mL/hr over 240 Minutes Intravenous 3 times per day 05/12/2015 1913       Assessment: Patient found unresponsive and admitted with DKA in septic shock. Pharmacy consulted to dose vancomycin and piperacillin/tazobactam.   Goal of Therapy:  Vancomycin trough level 15-20 mcg/ml  Plan:  Piperacillin/tazobactam  dosed at 3.375g IV q8h extended infusion based on patients renal function.  Based on patient's weight and normalized CrCl of ~25 mL/min, gave a 2 g loading dose of vancomycin (20 mg/kg) followed by 1500 mg IV q48 hours with trough ordered prior to 3rd dose. Pharmacy will monitor renal function and vancomycin levels and adjust doses as needed.   Luis Perry 05/07/2015,7:57 PM

## 2015-05-13 NOTE — ED Provider Notes (Signed)
[facility] Emergency Department Provider Note  ____________________________________________  Time seen: On arrival at 63  I have reviewed the triage vital signs and the nursing notes.  Limited history due to the patient being unresponsive.  History from paramedics on arrival. Additional history from fianc later during the patient's stay.  HISTORY  Chief Complaint Altered Mental Status  unresponsive Hyperglycemia   HPI Luis Perry is a 49 y.o. male who was found unresponsive by his fianc today. She reported to EMS that he had not been feeling well for a few days. She left the house for work this morning at approximately 7 AM. EMS reports that she said that he had grunted before she left the house. She called twice to check on him but he never answered the call. She returned home to check on him and found him unresponsive at approximately 2:00.  Upon arrival at the house, EMS found the unresponsive patient. They noted he had a pulse but it was very weak and there were not able to obtain a blood pressure reading. They established an IV and began IV fluids. He had a heart rate in the 70s and 80s. His glucose level was not measurable on glucometer (critical high). His oxygen saturation level was in the 50s with supplemental oxygen. They moved to control the airway with intubation, but his jaw was clamped down. They continued bag-valve-mask in route and saw 02 sats as high as 70%. They noted that his pupils were dilated and fixed. The corneas appeared dry.  On arrival, I found the patient in essentially the condition outlined above. He had a heart rate in the 80s. He was cold to the touch and had a temperature of 91.1. His glucose was critical high. He needed ongoing respiratory support.   Past Medical History  Diagnosis Date  . SOB (shortness of breath)   . Tachycardia   . Chest pain   . Hypertension     Patient Active Problem List   Diagnosis Date Noted   . Hyperglycemia 05/23/2015  . 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 HYPERTENSION, UNSPECIFIED 03/05/2010  . ATRIAL TACHYCARDIA 03/05/2010  . TACHYCARDIA 03/05/2010  . DYSPNEA 03/05/2010  . CHEST PAIN-UNSPECIFIED 03/05/2010    No past surgical history on file.  Current Outpatient Rx  Name  Route  Sig  Dispense  Refill  . oxyCODONE (OXY IR/ROXICODONE) 5 MG immediate release tablet   Oral   Take 5 mg by mouth every 4 (four) hours as needed for severe pain.         Marland Kitchen lisinopril-hydrochlorothiazide (PRINZIDE,ZESTORETIC) 10-12.5 MG per tablet   Oral   Take 1 tablet by mouth daily.           . metoprolol (LOPRESSOR) 100 MG tablet   Oral   Take 100 mg by mouth 2 (two) times daily.           . Multiple Vitamin (MULTIVITAMIN) tablet   Oral   Take 1 tablet by mouth daily.             Allergies Review of patient's allergies indicates no known allergies.  Family History  Problem Relation Age of Onset  . Coronary artery disease Other   . Hypertension Other     Social History Social History  Substance Use Topics  . Smoking status: Unknown If Ever Smoked  . Smokeless tobacco: Not on file  . Alcohol Use: 3.0 oz/week    6 drink(s) per week     Comment: 6  pack a week    Review of Systems Review of systems not possible due to the patient being unresponsive.   ____________________________________________   PHYSICAL EXAM:  VITAL SIGNS: ED Triage Vitals  Enc Vitals Group     BP Jun 04, 2015 1515 72/49 mmHg     Pulse Rate Jun 04, 2015 1515 96     Resp 06-04-15 1515 20     Temp 06/04/15 1515 91.1 F (32.8 C)     Temp Source June 04, 2015 1515 Rectal     SpO2 --      Weight --      Height --      Head Cir --      Peak Flow --      Pain Score --      Pain Loc --      Pain Edu? --      Excl. in GC? --     Constitutional:  Unresponsive patient, cool to the touch, dilated pupils, requiring respiratory support. ENT   Head: Normocephalic and atraumatic.   Eyes:  Pupils at approximately 5 mm, fixed. The corneas appear dry.   Mouth/Throat: Some thickened secretions in the mouth. Noted multiple caries with poor dentition at the base of many teeth. Cardiovascular: Normal rate, regular rhythm, heart sounds present but faint. Respiratory:  No respiratory effort. Patient is being ventilated with bag-valve-mask. There are breath sounds present with our supportive ventilations.   Gastrointestinal: Soft. No distention.  Musculoskeletal: No deformity noted. The patient does have what appears to be a postsurgical scar on his right hip. This is well healed.  No noted edema. Neurologic: Patient is unresponsive with fixed pupils..  Skin:  Skin is cool to the touch.     LABS (pertinent positives/negatives)  Labs Reviewed  COMPREHENSIVE METABOLIC PANEL - Abnormal; Notable for the following:    Potassium 5.2 (*)    Chloride 99 (*)    CO2 6 (*)    Glucose, Bld 981 (*)    BUN 31 (*)    Creatinine, Ser 3.78 (*)    Total Protein 8.5 (*)    AST 52 (*)    Alkaline Phosphatase 149 (*)    Total Bilirubin 1.3 (*)    GFR calc non Af Amer 17 (*)    GFR calc Af Amer 20 (*)    Anion gap 36 (*)    All other components within normal limits  LACTIC ACID, PLASMA - Abnormal; Notable for the following:    Lactic Acid, Venous 14.9 (*)    All other components within normal limits  PROTIME-INR - Abnormal; Notable for the following:    Prothrombin Time 19.0 (*)    All other components within normal limits  URINALYSIS COMPLETEWITH MICROSCOPIC ([facility] ONLY) - Abnormal; Notable for the following:    Color, Urine STRAW (*)    APPearance CLEAR (*)    Glucose, UA >500 (*)    Ketones, ur 2+ (*)    Hgb urine dipstick 2+ (*)    Protein, ur 100 (*)    Bacteria, UA RARE (*)    Squamous Epithelial / LPF 0-5 (*)    All other components within normal limits  BLOOD GAS, VENOUS - Abnormal; Notable for the following:    pH, Ven 6.47 (*)    All other components within normal limits   GLUCOSE, CAPILLARY - Abnormal; Notable for the following:    Glucose-Capillary >600 (*)    All other components within normal limits  GLUCOSE, CAPILLARY - Abnormal; Notable for the  following:    Glucose-Capillary >600 (*)    All other components within normal limits  URINE CULTURE  CULTURE, BLOOD (ROUTINE X 2)  CULTURE, BLOOD (ROUTINE X 2)  TROPONIN I  ETHANOL  CBC WITH DIFFERENTIAL/PLATELET  AMMONIA  LACTIC ACID, PLASMA  URINE DRUG SCREEN, QUALITATIVE ([facility] ONLY)  CBG MONITORING, ED     ____________________________________________   EKG  ED ECG REPORT I, Teagen Mcleary W, the attending physician, personally viewed and interpreted this ECG.   Date: 05/14/2015  EKG Time: 1509  Rate: 96  Rhythm: Normal sinus rhythm  Axis: Normal  Intervals: Normal  ST&T Change: None noted   ____________________________________________    RADIOLOGY  CT head: IMPRESSION: No focal acute intracranial abnormality identified.   Chest x-ray: IMPRESSION: Endotracheal tube in good position. No visible acute abnormalities.  IJanalyn Harder, personally viewed this imaging to confirm good endotracheal tube placement.   ____________________________________________   PROCEDURES  INTUBATION Performed by: Darien Ramus  Required items: required blood products, implants, devices, and special equipment available Patient identity confirmed: provided demographic data and hospital-assigned identification number Time out: Immediately prior to procedure a "time out" was called to verify the correct patient, procedure, equipment, support staff and site/side marked as required.  Indications: Unresponsive, respiratory failure   Intubation method: Glidescope Laryngoscopy   Preoxygenation: BVM  Paralytic: Rocuronium 80 mg   Tube Size: 8.0 cuffed  Post-procedure assessment: chest rise and ETCO2 monitor Breath sounds: equal and absent over the epigastrium Tube secured with: ETT  holder Chest x-ray interpreted by radiologist and me.  Chest x-ray findings: endotracheal tube in appropriate position  Patient tolerated the procedure well with no immediate complications.  CRITICAL CARE Performed by: Darien Ramus   Total critical care time: 60 minutes due to the critical nature of this unresponsive patient with profound hypotension and respiratory failure. This included conversations with the fianc, close attention in the treatment room, and discussions with the admitting service.  Critical care time was exclusive of separately billable procedures and treating other patients.  Critical care was necessary to treat or prevent imminent or life-threatening deterioration.  Critical care was time spent personally by me on the following activities: development of treatment plan with patient and/or surrogate as well as nursing, discussions with consultants, evaluation of patient's response to treatment, examination of patient, obtaining history from patient or surrogate, ordering and performing treatments and interventions, ordering and review of laboratory studies, ordering and review of radiographic studies, pulse oximetry and re-evaluation of patient's condition.      ____________________________________________   INITIAL IMPRESSION / ASSESSMENT AND PLAN / ED COURSE  Pertinent labs & imaging results that were available during my care of the patient were reviewed by me and considered in my medical decision making (see chart for details).  Critically ill, unresponsive 49 year old male with a history of diabetes and hypertension. He has unmeasurable critical high blood sugar levels. He is cold and unresponsive with fixed pupils.  We have aggressively resuscitated the patient with 3 L of normal saline (we will continue with lactated Ringer's from here), intubated him and are continuing ventilatory support, and awaiting lab results, chest x-ray, and head CT.  At 1535 I  spoke with the patient's fianc and received additional information from her. She reports that when she came home at 2:00 he did respond to her in a limited way. She reports he rose up some and said "I'm fine". She recognized that he clearly was not fine and call 911.  She does  not believe he has been compliant with his medications. For his diabetes he is only on metformin.  ----------------------------------------- 3:47 PM on May 17, 2015 -----------------------------------------  Blood pressure remains at 87/47. We have nor epi pending if we do not see a better response from the fluid resuscitation. Lactated Ringer's has now been home. We have lost the IV in the right arm. That is being replaced now. CT head is pending.   ----------------------------------------- 4:17 PM on May 17, 2015 -----------------------------------------  Blood pressure is 87/33. MHP of 49. We will start Levophed at 2 mg currently. I have also ordered a bicarbonate drip due to his recognized acidosis. He does have ketones in his urine. Blood tests are still pending. I have discussed the case with Dr. Juliene Pina.  ----------------------------------------- 4:45 PM on 05/17/15 -----------------------------------------  I have reviewed the available laboratory results. We have started him on a bicarbonate drip as noted above due to the profound acidosis. We are beginning an insulin drip now as well. I have again conferred with Dr. Juliene Pina who will admit the patient to the ICU.  ____________________________________________   FINAL CLINICAL IMPRESSION(S) / ED DIAGNOSES  Final diagnoses:  Hyperglycemia  Diabetic ketoacidosis with coma associated with type 2 diabetes mellitus  Hypotension, unspecified hypotension type  Acute respiratory failure with hypoxia      Darien Ramus, MD 05/17/2015 [phone]

## 2015-05-13 NOTE — Progress Notes (Signed)
eLink Physician-Brief Progress Note Patient Name: KERN GINGRAS DOB: 10-07-1965 MRN: [id number]   Date of Service  Jun 10, 2015  HPI/Events of Note  pcxr line good ett good  eICU Interventions  Await cvp     Intervention Category Major Interventions: Hypoxemia - evaluation and management  Acelin Ferdig J. 2015/06/10, 8:00 PM

## 2015-05-13 NOTE — Progress Notes (Signed)
   14-May-2015 1900  Clinical Encounter Type  Visited With Family  Visit Type Follow-up  Stress Factors  Family Stress Factors None identified   Chaplain followed up with patient's fiance and extended family.   Frederica Kuster [phone]

## 2015-05-13 NOTE — H&P (Signed)
[facility] - [place] at [facility]   PATIENT NAME: Luis Perry    MR#:  [id number]  DATE OF BIRTH:  [birth year]  DATE OF ADMISSION:  05/26/2015  PRIMARY CARE PHYSICIAN: No primary care provider on file.   REQUESTING/REFERRING PHYSICIAN: Dr Carollee Massed  CHIEF COMPLAINT:  Unresponsive HISTORY OF PRESENT ILLNESS:  Luis Perry  is a 49 y.o. male with a known history of diabetes and hypertension today. Apparently he has not been feeling well for the past 4 days. Luis Perry reports that he has been complaining of a stomach bug. Despite this he has been in his usual state of health with the exception of some fatigue. This morning she saw him and he was doing okay so she went to work. After several attempts to contact the patient, fianc went to the home where she found him pretty much unresponsive. EMS was then called. Upon arrival by EMS patient was unresponsive. He was found to be hypoxic, hyperglycemic with critical value of glucose and hypotensive. Patient was bag valve mask en route. Upon arrival in the emergency room patient was cold to touch and was hypothermic along with a critically high glucose level. He was subsequently intubated for hypoxic respiratory failure. CT the head shows no acute changes and chest x-ray shows no acute pneumonia or pulmonary/ cardiac process. He is started on Levophed for low blood pressure and as mentioned is intubated. She reports that he was recently diagnosed with diabetes. She reports to me that he is taking medications. It appears through the pharmacy the only medication he is taking is oxycodone. She reports that he does not drink or smoke. 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  Wasunresponsive.ISTORY:   Past Medical History  Diagnosis Date  . SOB (shortness of breath)   . Tachycardia   . Chest pain   . Hypertension     PAST SURGICAL HISTORY:  None  SOCIAL HISTORY:  None  FAMILY HISTORY:    Family History  Problem Relation Age of Onset  . Coronary artery disease Other   . Hypertension Other     DRUG ALLERGIES:  No known drug allergies  REVIEW OF SYSTEMS:  Patient intubated and sedated  MEDICATIONS AT HOME:  Oxycodone 5 mg 1-3 tablets every 3-4 hours when necessary     VITAL SIGNS:  Blood pressure 72/49, pulse 96, temperature 91.1 F (32.8 C), temperature source Rectal, resp. rate 20, height 6' (1.829 m), weight 99.791 kg (220 lb).  PHYSICAL EXAMINATION:  GENERAL:  49 y.o.-year-old patient lying in the bed with no acute distress intubated sedated.  EYES: Fixed dilated pupils.  HEENT: Head atraumatic, normocephalic. ET tube tube placed  NECK:  no jugular venous distention. No thyroid enlargement, no tenderness.  LUNGS: Normal breath sounds bilaterally, no wheezing, rales,rhonchi or crepitation. No use of accessory muscles of respiration. Patient intubated CARDIOVASCULAR: S1, S2 normal. No murmurs, rubs, or gallops.  ABDOMEN: Soft, nontender, nondistended. Bowel sounds present. Hard to appreciate organomegaly due to body habitus  EXTREMITIES: No pedal edema, cyanosis, or clubbing.  NEUROLOGIC: Intubated and sedated PSYCHIATRIC: Intubated and sedated SKIN: No obvious rash, lesion, or ulcer.   LABORATORY PANEL:   CBC Pending ------------------------------------------------------------------------------------------------------------------  Sodium 141 potassium 5.2 chloride 99 CO2 6 BUN 31 creatinine 3.78 calcium 9.6 troponin less than 0.03 INR 1.57 glucose 981 ------------------------------------------------------------------------------------------------------------------ AG 36 Lactic acid 14.1 FTS noraml Cardiac Enzymes No results for input(s): TROPONINI in the last 168 hours. ------------------------------------------------------------------------------------------------------------------  RADIOLOGY:  Ct Head Wo Contrast  2015-05-26  CLINICAL DATA:   Unresponsive  EXAM: CT HEAD WITHOUT CONTRAST  TECHNIQUE: Contiguous axial images were obtained from the base of the skull through the vertex without intravenous contrast.  COMPARISON:  None.  FINDINGS: There is no midline shift, hydrocephalus, or mass. No acute hemorrhage or acute transcortical infarct is identified. There is left maxillary sinus retention cyst. Mild mucoperiosteal thickening of the right ethmoid sinuses identified. The bony calvarium is intact.  IMPRESSION: No focal acute intracranial abnormality identified.   Electronically Signed   By: Sherian Rein M.D.   On: 06-Jun-2015 16:05   Dg Chest Port 1 View  06-06-15   CLINICAL DATA:  Altered mental status.  Intubation.  EXAM: PORTABLE CHEST - 1 VIEW  COMPARISON:  Chest x-ray dated 02/22/2010  FINDINGS: Endotracheal tube is in good position. Heart size and pulmonary vascularity are normal. Lungs are clear. No acute osseous abnormality.  IMPRESSION: Endotracheal tube in good position.  No visible acute abnormalities.   Electronically Signed   By: Francene Boyers M.D.   On: 06-06-15 15:48    EKG:    IMPRESSION AND PLAN:  49 year old male with a history of diabetes and hypertension who presents with hypothermia, hypotension, hyperglycemia and hypoxic respiratory failure.  1. Hypoxic respiratory failure, acute: Patient is now intubated and sedated on the ventilator suspect that DKA may be playing a role in his hypoxic respiratory failure. Chest x-ray reveals no acute pathology. Due to the critical illness of this patient I started broad-spectrum antibiotics including Zosyn and vancomycin. I have consulted pulmonary for vent management. I have already spoken with the intensivist.  2. DKA: Patient will be placed on insulin DKA protocol. I have also started bicarbonate drip and consulted endocrinology Blood sugars will be monitored every hour and BMP every 4 hours. Lactic acid is 14!!  3. Hypotension: Likely secondary to DKA/dehydration  versus sepsis. Blood and urine cultures were ordered. Patient is on levophed which I will continue. He may also require dopamine if MAPnot >then 65.  4. Hypothermia: This is from sepsis and hyperglycemia.Continue Cytogeneticist. Follow up on blood and urine cultures.   5. Unresponsiveness: This is likely secondary to hyperglycemic coma. Patient is currently intubated with fixed and dilated pupils. CT the head shows no acute intracranial hemorrhage. Continue to monitor closely with neuro checks. Have requested NEUROLOGY consultation and ordered urine toxicology   6. Acute renal failure: I suspect this is all related to his DKA and severe dehydration. Patient already received 5 L of fluids in the ER. I will continue aggressive hydration and avoid nephrotoxic agents. I will place an order to monitor intake and output. I will consult nephrology due to this severe illness of this patient. I have already spoken with Nephrology.  All the records are reviewed and case discussed with ED provider. Patient is critically ill and high risk of cardiopulmonary arrestt and mortality Discussed critical illness with family at bedside CODE STATUS: FULL  CRITICAL CARE TOTAL TIME TAKING CARE OF THIS PATIENT: 60 minutes.    Emon Miggins M.D on 06-06-2015 at 4:24 PM  Between 7am to 6pm - Pager - [phone] After 6pm go to [email or web address] - password [email or web address] [facility]  [place] [facility]  Office  [phone]  CC: Primary care physician; No primary care provider on file.

## 2015-05-13 NOTE — Progress Notes (Signed)
   19-May-2015 1707  Clinical Encounter Type  Visited With Family  Visit Type ED  Referral From Nurse  Consult/Referral To Chaplain  Spiritual Encounters  Spiritual Needs Prayer  Stress Factors  Family Stress Factors Health changes   Chaplain paged to unit and visited with patient's fiance where I prayed and offered emotional support.   Frederica Kuster [phone]

## 2015-05-13 NOTE — Progress Notes (Addendum)
eLink Physician-Brief Progress Note Patient Name: [name] DOB: 1966/01/29 MRN: [id number]   Date of Service  05/25/2015  HPI/Events of Note  Uncompensated met acidosis, PH 6.9 Rate to 35 LA 17 now Source ischeemic? D/w Dr Ashby Dawes For CT abdo/pelvis, chest if able to transpotr   eICU Interventions  I will also add empiric Aningulofungin maintain zostyn=vanc Family to  Be notified [facility] i dont see metformin on his list home meds May need CVVHD K just back, restart biocarn for this   NOw just re reported ph 6.59 Unlikely to survive, updating mother now     Intervention Category Major Interventions: Hypercarbia - evaluation and management  Luis Perry J. 05/08/2015, 8:37 PM

## 2015-05-13 NOTE — ED Notes (Signed)
CRITICAL VALUE ALERT  Critical value received:  Glucose 981 Lactic acid 14.9  Date of notification:  19-May-2015  Time of notification:  1625  Critical value read back: Yes  Nurse who received alert:  Dorinda Hill, RN  MD notified (1st page):  1625  Time of first page:    MD notified (2nd page):  Time of second page:  Responding MD:  Carollee Massed  Time MD responded:  [phone]

## 2015-05-13 NOTE — Progress Notes (Signed)
eLink Physician-Brief Progress Note Patient Name: Luis Perry DOB: December 16, 1965 MRN: [id number]   Date of Service  06/12/2015  HPI/Events of Note  ABg reviewed  eICU Interventions  Tv to 600, ensure at 8 cc/kg fio2 to 40%, peep 5 D/w Rt Orders placed     Intervention Category Major Interventions: Acid-Base disturbance - evaluation and management  [name] 06-12-2015, 9:49 PM

## 2015-05-13 NOTE — Progress Notes (Addendum)
eLink Physician-Brief Progress Note Patient Name: Luis Perry DOB: [birth year] MRN: [id number]   Date of Service  06-08-2015  HPI/Events of Note  New Pt evaluation Found down, never lost pulse but LA 14 now rising 15 Shock on levopghed  eICU Interventions  Concern septic shock, WBC noted, A line going in by ICU team Consider ss protocol Repeat LA ensure got 30 ml/kg, BC Get cvp May need svo2 D/w Intensivist conisder wean precedex off in setting shock, add fent Role bicarb limited unless K rise Consider renal US     Intervention Category Evaluation Type: New Patient Evaluation  [name] 2015-06-08, 6:40 PM

## 2015-05-13 NOTE — Procedures (Signed)
Central Venous Catheter Placement: Indication: Patient receiving vesicant or irritant drug.; Patient receiving intravenous therapy for longer than 5 days.; Patient has limited or no vascular access.   Consent: Emergent  Risks and benefits explained in detail including risk of infection, bleeding, respiratory failure and death..   Hand washing performed prior to starting the procedure.   Procedure: An active timeout was performed and correct patient, name, & ID confirmed.  After explaining risk and benefits, patient was positioned correctly for central venous access. Patient was prepped using strict sterile technique including chlorohexadine preps, sterile drape, sterile gown and sterile gloves.  The area was prepped, draped and anesthetized in the usual sterile manner. Patient comfort was obtained.  A triple lumen catheter was placed in left Internal Jugular Vein There was good blood return, catheter caps were placed on lumens, catheter flushed easily, the line was secured and a sterile dressing and BIO-PATCH applied.   Ultrasound was used to visualize vasculature and guidance of needle.   Number of Attempts: 1 Complications:none Estimated Blood Loss: Minimal Chest Radiograph indicated and ordered.   Operator: Wells Guiles, M.D.   Wells Guiles, M.D.  Pulmonary & Critical Care Medicine

## 2015-05-13 NOTE — Consult Note (Signed)
  Procedure Note:  Arterial Line Placement LINC RENNE , [id number] , IC10A/IC10A-AA  Indications:  Hemodynamic instability / recurrent ABG draws  The procedures performed emergently.  Allen's test performed to ensure adequate perfusion.  Time out was performed verifying correct patient, procedure, site, positioning, and special catheter was available at the time of procedure.  Patient's right femoral artery was prepped and draped in usual sterile fashion.  1 % Lidocaine was not used to anesthetize the area. As the patient did not respond to pain.  Total number of attempts were to.  A 20 gauge arterial line was introduced into the right femoral artery.  Catheter threaded and the needle was removed with appropriate blood return.  Blood loss was minimal.  Patient tolerated the procedure well, and there were no complications.   After initial placement was noted that this was likely a venous placement. Ultrasound was once again used and the femoral artery was cannulated on the second attempt here, the blood return appeared to be arterial. This was looked up to a pressure bag and was confirmed arterial placement.  Shane Crutch, MD [facility] Pager [phone] On Call Pager [phone]

## 2015-05-13 NOTE — Progress Notes (Signed)
eLink Physician-Brief Progress Note Patient Name: Luis Perry DOB: [birth year] MRN: [id number]   Date of Service  05-19-2015  HPI/Events of Note  LActic acidosis DKA  eICU Interventions  No role bicarb, will delay resolution acidosis and other SE Get ABg now Dc bicarb unless K rise k last wnl     Intervention Category Major Interventions: Acid-Base disturbance - evaluation and management  [name] J. 05-19-2015, 8:03 PM

## 2015-05-13 NOTE — Progress Notes (Signed)
eLink Physician-Brief Progress Note Patient Name: Luis Perry DOB: 1965-11-16 MRN: [id number]   Date of Service  2015/05/14  HPI/Events of Note  Updated mom, fiance, aunt Banker) on grave picture of global hypoperfusion, MODS PH 6.59 Appears unsurvivable I suggested DNR  eICU Interventions  They will think about it He is too unstable for CT Repeat abg awaited      Intervention Category Major Interventions: End of life / care limitation discussion  [name] 05-14-15, 9:42 PM

## 2015-05-13 NOTE — Progress Notes (Signed)
eLink Physician-Brief Progress Note Patient Name: Luis Perry DOB: Mar 14, 1966 MRN: [id number]   Date of Service  05/25/2015  HPI/Events of Note  K noted  eICU Interventions  Insulin bolus, kayx, bicarb, albut, calcium Repeat K Correcting ph May need cvvhd     Intervention Category Major Interventions: Acid-Base disturbance - evaluation and management  [name] J. 05/26/2015, 8:47 PM

## 2015-05-13 NOTE — Consult Note (Addendum)
[facility]  Critical Care Medicine Consultation     ASSESSMENT/PLAN  The patient is a 49 year old male with history significant for diabetes mellitus, now presents after being found down found to have severe metabolic acidosis with extremely high blood glucose levels. He has a significant lactic acidosis, patient is also unresponsive with metabolic encephalopathy of uncertain etiology.  PULMONARY OETT A: Acute respiratory failure secondary to DKA and unresponsiveness. P:   Continue ventilator support at least until the patient's mental status is improved.  CARDIOVASCULAR  A: Hypotension with septic shock. Elevated T waves in lateral leads, suspect stress ischemia. P:  A central line has been placed. The patient has been given an additional bolus of fluids in addition to the 4 L of crystalloid. He is already received. I bolused a liter of his bicarbonate drip. I will continue to monitor his status. He was initially on a Levophed level of 5 mics. This has been titrated up. We will check troponins and monitor trend. -Sepsis protocol initiated, and addition, the patient has received stress dose steroids and we will check a cortisol level.  RENAL A:  Acute kidney injury P:   The patient has just been transported up to the ICU. He appears to have urine and his Foley bag. However, it is too early to tell whether he is oliguric. We'll continue IV resuscitation.  GASTROINTESTINAL Will start tube feeds when appropriate and GI prophylaxis. The patient's ammonia level was elevated uncertain if this is due to alcohol abuse. We'll continue to monitor. We'll consider starting lactulose. If his mental status is not improved. Per the chart. He has no history of significant alcohol abuse  HEMATOLOGIC A:  Leukocytosis P:  Likely stress-induced, will follow.  INFECTIOUS A: Suspect septic shock, source uncertain. P:   We'll panculture the patient and started on empiric antibiotics. Continue  sepsis protocol   ENDOCRINE A:  Diabetic ketoacidosis. Severe hypothermia. P:   Uncertain at this point what would cause the patient to go to into DKA. We will maintain the patient on insulin drip per protocol. Will check a TSH.   NEUROLOGIC A:  Unresponsive. Metabolic encephalopathy. P:   Suspect this is secondary to hypothermia, hypotension and septic shock. We'll monitor the patient's neurological status as his other conditions improve.    INDWELLING DEVICES:: Left internal jugular central line: 05/06/2015 Right femoral art line placed 05/01/2015  MICRO DATA: MRSA PCR  Urine  Blood Resp   ANTIMICROBIALS:    ---------------------------------------  ---------------------------------------   Name: Luis Perry MRN: [id number] DOB: [birth year]    ADMISSION DATE:  05/06/2015 CONSULTATION DATE:  05/07/2015  REFERRING MD :  Dr. Mitzi Hansen  CHIEF COMPLAINT:  Unresponsive.    HISTORY OF PRESENT ILLNESS:   Patient is currently intubated and unresponsive, therefore, all history was obtained from the chart and from staff. Per the staff. The patient was brought in unresponsive and subsequently intubated due to his obtunded mental status. The patient had an initial chemistry panel showed a creatinine of 3.78, a glucose of 981 and a carbon dioxide level of 6 as well as an anion gap level of 36. Urine tox screen was negative and the elbow alcohol level was less than 5 I reviewed his initial chest radiograph which did not show any remarkable changes. His ammonia level was elevated at 283. Venous lactic acid was 15. An arterial blood gas could not be obtained due to lack of pulses in the radial arteries remotely. The patient did have a measurable blood pressure  by cuff initially in the systolic of 60s, after receiving a prostate 4 L of IV fluids. His systolic blood pressure increased to 80s over 50. Venous blood gas showed a pH of 6.47. The patient was also placed on a bicarbonate  drip.     Per chart: Luis Perry is a 49 y.o. male with a known history of diabetes and hypertension today. Apparently he has not been feeling well for the past 4 days. Neysa Bonito reports that he has been complaining of a stomach bug. Despite this he has been in his usual state of health with the exception of some fatigue. This morning she saw him and he was doing okay so she went to work. After several attempts to contact the patient, fianc went to the home where she found him pretty much unresponsive. EMS was then called. Upon arrival by EMS patient was unresponsive. He was found to be hypoxic, hyperglycemic with critical value of glucose and hypotensive. Patient was bag valve mask en route. Upon arrival in the emergency room patient was cold to touch and was hypothermic along with a critically high glucose level. He was subsequently intubated for hypoxic respiratory failure. CT the head shows no acute changes and chest x-ray shows no acute pneumonia or pulmonary/ cardiac process. He is started on Levophed for low blood pressure and as mentioned is intubated. She reports that he was recently diagnosed with diabetes. She reports to me that he is taking medications. It appears through the pharmacy the only medication he is taking is oxycodone. She reports that he does not drink or smoke.  PAST MEDICAL HISTORY :  Past Medical History  Diagnosis Date  . SOB (shortness of breath)   . Tachycardia   . Chest pain   . Hypertension   . Diabetes    No past surgical history on file. Prior to Admission medications   Medication Sig Start Date End Date Taking? Authorizing Provider  oxyCODONE (OXY IR/ROXICODONE) 5 MG immediate release tablet Take 5-10 mg by mouth every 4 (four) hours as needed for severe pain.    Yes Historical Provider, MD   No Known Allergies  FAMILY HISTORY:  Family History  Problem Relation Age of Onset  . Coronary artery disease Other   . Hypertension Other    SOCIAL HISTORY:   reports that he drinks about 3.0 oz of alcohol per week. He reports that he does not use illicit drugs. His tobacco history is not on file.  REVIEW OF SYSTEMS:   Constitutional: Feels well. Cardiovascular: No chest pain.  Pulmonary: Denies dyspnea.   The remainder of systems were reviewed and were found to be negative other than what is documented in the HPI.    VITAL SIGNS: Temp:  [89.9 F (32.2 C)-92 F (33.3 C)] 91.8 F (33.2 C) (09/14 1730) Pulse Rate:  [84-96] 84 (09/14 1715) Resp:  [18-22] 19 (09/14 1730) BP: (67-89)/(18-54) 81/31 mmHg (09/14 1730) SpO2:  [88 %-100 %] 89 % (09/14 1715) FiO2 (%):  [100 %] 100 % (09/14 1530) Weight:  [99.791 kg (220 lb)] 99.791 kg (220 lb) (09/14 1522) HEMODYNAMICS:   VENTILATOR SETTINGS: Vent Mode:  [-] AC FiO2 (%):  [100 %] 100 % Set Rate:  [16 bmp] 16 bmp Vt Set:  [550 mL] 550 mL PEEP:  [5 cmH20] 5 cmH20 INTAKE / OUTPUT: No intake or output data in the 24 hours ending 30-May-2015 1756  Physical Examination:   VS: BP 81/31 mmHg  Pulse 84  Temp(Src) 91.8 F (  33.2 C) (Rectal)  Resp 19  Ht 6' (1.829 m)  Wt 99.791 kg (220 lb)  BMI 29.83 kg/m2  SpO2 89%  General Appearance: Unresponsive Neuro:without focal findings, the patient's pupils are midpoint and fixed. His doll's eye reflex is negative HEENT: PERRLA, neck is normal Pulmonary: normal breath sounds., diaphragmatic excursion normal.No wheezing, No rales;     CardiovascularNormal S1,S2.  No m/r/g.    Abdomen: Benign, Soft, non-tender, No masses,  Renal:  No costovertebral tenderness  GU:  Not performed at this time. Endoc: No evident thyromegaly, no signs of acromegaly. Skin:   warm, no rashes, no ecchymosis  Extremities: normal, no cyanosis, clubbing, no edema, warm with normal capillary refill.    LABS: Reviewed   LABORATORY PANEL:   CBC  Recent Labs Lab 22-May-2015 1504  WBC 28.4*  HGB 13.8  HCT 48.8  PLT 160    Chemistries   Recent Labs Lab 2015/05/22 1504   NA 141  K 5.2*  CL 99*  CO2 6*  GLUCOSE 981*  BUN 31*  CREATININE 3.78*  CALCIUM 9.6  AST 52*  ALT 22  ALKPHOS 149*  BILITOT 1.3*     Recent Labs Lab 05-22-15 1513 May 22, 2015 1515  GLUCAP >600* >600*   No results for input(s): PHART, PCO2ART, PO2ART in the last 168 hours.  Recent Labs Lab 05-22-2015 1504  AST 52*  ALT 22  ALKPHOS 149*  BILITOT 1.3*  ALBUMIN 4.6    Cardiac Enzymes  Recent Labs Lab 2015-05-22 1504  TROPONINI <0.03    RADIOLOGY:  Ct Head Wo Contrast  2015-05-22   CLINICAL DATA:  Unresponsive  EXAM: CT HEAD WITHOUT CONTRAST  TECHNIQUE: Contiguous axial images were obtained from the base of the skull through the vertex without intravenous contrast.  COMPARISON:  None.  FINDINGS: There is no midline shift, hydrocephalus, or mass. No acute hemorrhage or acute transcortical infarct is identified. There is left maxillary sinus retention cyst. Mild mucoperiosteal thickening of the right ethmoid sinuses identified. The bony calvarium is intact.  IMPRESSION: No focal acute intracranial abnormality identified.   Electronically Signed   By: Sherian Rein M.D.   On: May 22, 2015 16:05   Dg Chest Port 1 View  22-May-2015   CLINICAL DATA:  Altered mental status.  Intubation.  EXAM: PORTABLE CHEST - 1 VIEW  COMPARISON:  Chest x-ray dated 02/22/2010  FINDINGS: Endotracheal tube is in good position. Heart size and pulmonary vascularity are normal. Lungs are clear. No acute osseous abnormality.  IMPRESSION: Endotracheal tube in good position.  No visible acute abnormalities.   Electronically Signed   By: Francene Boyers M.D.   On: May 22, 2015 15:48       --Wells Guiles, MD.  Board Certified in Internal Medicine, Pulmonary Medicine, Critical Care Medicine, and Sleep Medicine.  Pager [phone] [facility] Office Number: [phone]  Santiago Glad, M.D.  Stephanie Acre, M.D.  Billy Fischer, M.D   2015-05-22, 5:56 PM  Critical Care  Attestation.  I have personally obtained a history, examined the patient, evaluated laboratory and imaging results, formulated the assessment and plan and placed orders. The Patient requires high complexity decision making for assessment and support, frequent evaluation and titration of therapies, application of advanced monitoring technologies and extensive interpretation of multiple databases. The patient has critical illness that could lead imminently to failure of 1 or more organ systems and requires the highest level of physician preparedness to intervene.  Critical Care Time devoted to patient care services  described in this note is 55 minutes and is exclusive of time spent in procedures.

## 2015-05-13 NOTE — Progress Notes (Signed)
ANTICOAGULATION CONSULT NOTE - Initial Consult  Pharmacy Consult for Lovenox  Indication: VTE prophylaxis  No Known Allergies  Patient Measurements: Height: 6' (182.9 cm) Weight: 220 lb (99.791 kg) IBW/kg (Calculated) : 77.6 Heparin Dosing Weight:   Vital Signs: Temp: 91.8 F (33.2 C) (09/14 1730) Temp Source: Rectal (09/14 1515) BP: 81/31 mmHg (09/14 1730) Pulse Rate: 84 (09/14 1715)  Labs:  Recent Labs  05/29/2015 1504  HGB 13.8  HCT 48.8  PLT 160  LABPROT 19.0*  INR 1.57  CREATININE 3.78*  TROPONINI <0.03    Estimated Creatinine Clearance: 28.6 mL/min (by C-G formula based on Cr of 3.78).   Medical History: Past Medical History  Diagnosis Date  . SOB (shortness of breath)   . Tachycardia   . Chest pain   . Hypertension   . Diabetes     Medications:  Prescriptions prior to admission  Medication Sig Dispense Refill Last Dose  . oxyCODONE (OXY IR/ROXICODONE) 5 MG immediate release tablet Take 5-10 mg by mouth every 4 (four) hours as needed for severe pain.    PRN at PRN    Assessment: CrCl = 28.6 ml/min  Goal of Therapy:  dvt prophylaxis   Plan:  Lovenox 40 mg SQ Q24H originally ordered.  Will adjust dose to Lovenox 30 mg SQ Q24H based on CrCl < 30 ml/min.   Bandon Sherwin D 05-29-2015,6:46 PM

## 2015-05-14 ENCOUNTER — Inpatient Hospital Stay: Payer: Medicaid Other

## 2015-05-14 ENCOUNTER — Inpatient Hospital Stay
Admit: 2015-05-14 | Discharge: 2015-05-14 | Disposition: A | Discharge status: Home / Self Care | Payer: Medicaid Other | Attending: Pulmonary Disease | Admitting: Pulmonary Disease

## 2015-05-14 DIAGNOSIS — J9600 Acute respiratory failure, unspecified whether with hypoxia or hypercapnia: Secondary | ICD-10-CM

## 2015-05-14 DIAGNOSIS — E872 Acidosis: Secondary | ICD-10-CM

## 2015-05-14 DIAGNOSIS — E1311 Other specified diabetes mellitus with ketoacidosis with coma: Secondary | ICD-10-CM

## 2015-05-14 DIAGNOSIS — N179 Acute kidney failure, unspecified: Secondary | ICD-10-CM | POA: Insufficient documentation

## 2015-05-14 DIAGNOSIS — R579 Shock, unspecified: Secondary | ICD-10-CM

## 2015-05-14 LAB — RENAL FUNCTION PANEL
ALBUMIN: 2.4 g/dL — AB (ref 3.5–5.0)
ALBUMIN: 2.4 g/dL — AB (ref 3.5–5.0)
ALBUMIN: 2.7 g/dL — AB (ref 3.5–5.0)
ANION GAP: 18 — AB (ref 5–15)
ANION GAP: 20 — AB (ref 5–15)
Anion gap: 19 — ABNORMAL HIGH (ref 5–15)
BUN: 38 mg/dL — ABNORMAL HIGH (ref 6–20)
BUN: 37 mg/dL — ABNORMAL HIGH (ref 6–20)
BUN: 32 mg/dL — AB (ref 6–20)
CALCIUM: 6.6 mg/dL — AB (ref 8.9–10.3)
CHLORIDE: 111 mmol/L (ref 101–111)
CHLORIDE: 109 mmol/L (ref 101–111)
CO2: 14 mmol/L — ABNORMAL LOW (ref 22–32)
CO2: 14 mmol/L — AB (ref 22–32)
CO2: 19 mmol/L — ABNORMAL LOW (ref 22–32)
CREATININE: 5.41 mg/dL — AB (ref 0.61–1.24)
CREATININE: 5.51 mg/dL — AB (ref 0.61–1.24)
Calcium: 6.6 mg/dL — ABNORMAL LOW (ref 8.9–10.3)
Calcium: 6.9 mg/dL — ABNORMAL LOW (ref 8.9–10.3)
Chloride: 110 mmol/L (ref 101–111)
Creatinine, Ser: 5.19 mg/dL — ABNORMAL HIGH (ref 0.61–1.24)
GFR calc Af Amer: 13 mL/min — ABNORMAL LOW (ref >60)
GFR calc non Af Amer: 11 mL/min — ABNORMAL LOW (ref >60)
GFR, EST AFRICAN AMERICAN: 14 mL/min — AB (ref >60)
GFR, EST AFRICAN AMERICAN: 13 mL/min — AB (ref >60)
GFR, EST NON AFRICAN AMERICAN: 12 mL/min — AB (ref >60)
GFR, EST NON AFRICAN AMERICAN: 11 mL/min — AB (ref >60)
GLUCOSE: 590 mg/dL — AB (ref 65–99)
Glucose, Bld: 898 mg/dL (ref 65–99)
Glucose, Bld: 814 mg/dL (ref 65–99)
PHOSPHORUS: 1.5 mg/dL — AB (ref 2.5–4.6)
POTASSIUM: 2.7 mmol/L — AB (ref 3.5–5.1)
POTASSIUM: 2.8 mmol/L — AB (ref 3.5–5.1)
Potassium: 2.4 mmol/L — CL (ref 3.5–5.1)
SODIUM: 144 mmol/L (ref 135–145)
Sodium: 143 mmol/L (ref 135–145)
Sodium: 147 mmol/L — ABNORMAL HIGH (ref 135–145)

## 2015-05-14 LAB — URINALYSIS COMPLETE WITH MICROSCOPIC (ARMC ONLY)
Bilirubin Urine: NEGATIVE
LEUKOCYTES UA: NEGATIVE
Nitrite: NEGATIVE
Protein, ur: >500 mg/dL — AB
SPECIFIC GRAVITY, URINE: 1.014 (ref 1.005–1.030)
pH: 5 (ref 5.0–8.0)

## 2015-05-14 LAB — BASIC METABOLIC PANEL
ANION GAP: 24 — AB (ref 5–15)
ANION GAP: 27 — AB (ref 5–15)
Anion gap: 23 — ABNORMAL HIGH (ref 5–15)
BUN: 29 mg/dL — AB (ref 6–20)
BUN: 29 mg/dL — ABNORMAL HIGH (ref 6–20)
BUN: 33 mg/dL — ABNORMAL HIGH (ref 6–20)
CALCIUM: 6.4 mg/dL — AB (ref 8.9–10.3)
CHLORIDE: 116 mmol/L — AB (ref 101–111)
CHLORIDE: 113 mmol/L — AB (ref 101–111)
CO2: 6 mmol/L — AB (ref 22–32)
CO2: 12 mmol/L — AB (ref 22–32)
CO2: 9 mmol/L — AB (ref 22–32)
Calcium: 6.6 mg/dL — ABNORMAL LOW (ref 8.9–10.3)
Calcium: 6.4 mg/dL — CL (ref 8.9–10.3)
Chloride: 112 mmol/L — ABNORMAL HIGH (ref 101–111)
Creatinine, Ser: 3.86 mg/dL — ABNORMAL HIGH (ref 0.61–1.24)
Creatinine, Ser: 4.11 mg/dL — ABNORMAL HIGH (ref 0.61–1.24)
Creatinine, Ser: 4.33 mg/dL — ABNORMAL HIGH (ref 0.61–1.24)
GFR calc Af Amer: 19 mL/min — ABNORMAL LOW (ref >60)
GFR calc non Af Amer: 17 mL/min — ABNORMAL LOW (ref >60)
GFR calc non Af Amer: 16 mL/min — ABNORMAL LOW (ref >60)
GFR calc non Af Amer: 15 mL/min — ABNORMAL LOW (ref >60)
GFR, EST AFRICAN AMERICAN: 18 mL/min — AB (ref >60)
GFR, EST AFRICAN AMERICAN: 17 mL/min — AB (ref >60)
GLUCOSE: 671 mg/dL — AB (ref 65–99)
GLUCOSE: 707 mg/dL — AB (ref 65–99)
GLUCOSE: 796 mg/dL — AB (ref 65–99)
POTASSIUM: 5.5 mmol/L — AB (ref 3.5–5.1)
POTASSIUM: 4.9 mmol/L (ref 3.5–5.1)
POTASSIUM: 3.7 mmol/L (ref 3.5–5.1)
SODIUM: 145 mmol/L (ref 135–145)
Sodium: 149 mmol/L — ABNORMAL HIGH (ref 135–145)
Sodium: 148 mmol/L — ABNORMAL HIGH (ref 135–145)

## 2015-05-14 LAB — BLOOD GAS, ARTERIAL
Acid-base deficit: 7.1 mmol/L — ABNORMAL HIGH (ref 0.0–2.0)
Acid-base deficit: 3.9 mmol/L — ABNORMAL HIGH (ref 0.0–2.0)
BICARBONATE: 14.9 meq/L — AB (ref 21.0–28.0)
BICARBONATE: 19.2 meq/L — AB (ref 21.0–28.0)
FIO2: 0.6
FIO2: 0.4
FIO2: 40
FIO2: 0.4
FIO2: 0.3
LHR: 35 {breaths}/min
LHR: 16 {breaths}/min
MECHVT: 550 mL
MECHVT: 600 mL
MECHVT: 500 mL
Mechanical Rate: 35
Mechanical Rate: 35
O2 Saturation: 99.6 %
O2 Saturation: 99 %
PATIENT TEMPERATURE: 37
PATIENT TEMPERATURE: 37
PATIENT TEMPERATURE: 36.1
PCO2 ART: 24 mmHg — AB (ref 32.0–48.0)
PCO2 ART: 22 mmHg — AB (ref 32.0–48.0)
PCO2 ART: 19 mmHg — AB (ref 32.0–48.0)
PCO2 ART: 22 mmHg — AB (ref 32.0–48.0)
PEEP/CPAP: 5 cmH2O
PEEP/CPAP: 5 cmH2O
PEEP: 5 cmH2O
PH ART: 6.85 — AB (ref 7.350–7.450)
PH ART: 7.44 (ref 7.350–7.450)
PH ART: 7.44 (ref 7.350–7.450)
PO2 ART: 152 mmHg — AB (ref 83.0–108.0)
PO2 ART: 165 mmHg — AB (ref 83.0–108.0)
PO2 ART: 171 mmHg — AB (ref 83.0–108.0)
PRESSURE CONTROL: 15 cmH2O
Patient temperature: 37
Pressure control: 15 cmH2O
RATE: 35 resp/min
RATE: 20 resp/min
pCO2 arterial: 28 mmHg — ABNORMAL LOW (ref 32.0–48.0)
pH, Arterial: 6.67 — CL (ref 7.350–7.450)
pH, Arterial: 7.31 — ABNORMAL LOW (ref 7.350–7.450)
pO2, Arterial: 240 mmHg — ABNORMAL HIGH (ref 83.0–108.0)
pO2, Arterial: 124 mmHg — ABNORMAL HIGH (ref 83.0–108.0)

## 2015-05-14 LAB — CBC
HCT: 33 % — ABNORMAL LOW (ref 40.0–52.0)
Hemoglobin: 11 g/dL — ABNORMAL LOW (ref 13.0–18.0)
MCH: 29.8 pg (ref 26.0–34.0)
MCHC: 33.4 g/dL (ref 32.0–36.0)
MCV: 89.1 fL (ref 80.0–100.0)
Platelets: 74 10*3/uL — ABNORMAL LOW (ref 150–440)
RBC: 3.71 MIL/uL — ABNORMAL LOW (ref 4.40–5.90)
RDW: 13.6 % (ref 11.5–14.5)
WBC: 8.8 10*3/uL (ref 3.8–10.6)

## 2015-05-14 LAB — GLUCOSE, CAPILLARY
GLUCOSE-CAPILLARY: 353 mg/dL — AB (ref 65–99)
GLUCOSE-CAPILLARY: 560 mg/dL — AB (ref 65–99)
GLUCOSE-CAPILLARY: 571 mg/dL — AB (ref 65–99)
GLUCOSE-CAPILLARY: 573 mg/dL — AB (ref 65–99)
Glucose-Capillary: 311 mg/dL — ABNORMAL HIGH (ref 65–99)
Glucose-Capillary: 354 mg/dL — ABNORMAL HIGH (ref 65–99)
Glucose-Capillary: 409 mg/dL — ABNORMAL HIGH (ref 65–99)
Glucose-Capillary: 484 mg/dL — ABNORMAL HIGH (ref 65–99)
Glucose-Capillary: 518 mg/dL — ABNORMAL HIGH (ref 65–99)
Glucose-Capillary: 521 mg/dL — ABNORMAL HIGH (ref 65–99)
Glucose-Capillary: 586 mg/dL (ref 65–99)
Glucose-Capillary: 587 mg/dL (ref 65–99)
Glucose-Capillary: 599 mg/dL (ref 65–99)
Glucose-Capillary: >600 mg/dL (ref 65–99)
Glucose-Capillary: >600 mg/dL (ref 65–99)
Glucose-Capillary: >600 mg/dL (ref 65–99)
Glucose-Capillary: >600 mg/dL (ref 65–99)
Glucose-Capillary: >600 mg/dL (ref 65–99)

## 2015-05-14 LAB — POTASSIUM: POTASSIUM: 3.1 mmol/L — AB (ref 3.5–5.1)

## 2015-05-14 LAB — TROPONIN I
TROPONIN I: 0.07 ng/mL — AB (ref <0.031)
Troponin I: 0.04 ng/mL — ABNORMAL HIGH (ref <0.031)
Troponin I: 0.23 ng/mL — ABNORMAL HIGH (ref <0.031)

## 2015-05-14 LAB — HEMOGLOBIN A1C: HEMOGLOBIN A1C: 13.5 % — AB (ref 4.0–6.0)

## 2015-05-14 LAB — CORTISOL
Cortisol, Plasma: 30.4 ug/dL
Cortisol, Plasma: 33 ug/dL

## 2015-05-14 LAB — OSMOLALITY, URINE: OSMOLALITY UR: 367 mosm/kg — AB (ref 390–1090)

## 2015-05-14 LAB — LACTIC ACID, PLASMA: Lactic Acid, Venous: 7.8 mmol/L (ref 0.5–2.0)

## 2015-05-14 LAB — MRSA PCR SCREENING: MRSA by PCR: NEGATIVE

## 2015-05-14 MED ORDER — HYDROCORTISONE NA SUCCINATE PF 100 MG IJ SOLR
50.0000 mg | Freq: Two times a day (BID) | INTRAMUSCULAR | Status: DC
  Administered 2015-05-14 – 2015-05-15 (×2): 50 mg via INTRAVENOUS
  Filled 2015-05-14 (×2): qty 2

## 2015-05-14 MED ORDER — HEPARIN SODIUM (PORCINE) 1000 UNIT/ML DIALYSIS
1000.0000 [IU] | INTRAMUSCULAR | Status: DC | PRN
  Filled 2015-05-14: qty 6

## 2015-05-14 MED ORDER — SODIUM CHLORIDE 0.9 % IV SOLN
100.0000 mg | Freq: Two times a day (BID) | INTRAVENOUS | Status: DC
  Administered 2015-05-15: 100 mg via INTRAVENOUS
  Filled 2015-05-14 (×3): qty 10

## 2015-05-14 MED ORDER — SODIUM BICARBONATE 8.4 % IV SOLN
50.0000 meq | Freq: Once | INTRAVENOUS | Status: AC
  Administered 2015-05-14: 50 meq via INTRAVENOUS

## 2015-05-14 MED ORDER — ANTISEPTIC ORAL RINSE SOLUTION (CORINZ)
7.0000 mL | Freq: Four times a day (QID) | OROMUCOSAL | Status: DC
  Administered 2015-05-14 – 2015-05-15 (×6): 7 mL via OROMUCOSAL
  Filled 2015-05-14 (×9): qty 7

## 2015-05-14 MED ORDER — VASOPRESSIN 20 UNIT/ML IV SOLN
0.0300 [IU]/min | INTRAVENOUS | Status: DC
  Administered 2015-05-14: 0.03 [IU]/min via INTRAVENOUS
  Filled 2015-05-14: qty 2

## 2015-05-14 MED ORDER — ALTEPLASE 2 MG IJ SOLR
2.0000 mg | Freq: Once | INTRAMUSCULAR | Status: AC
  Administered 2015-05-14: 2 mg

## 2015-05-14 MED ORDER — INSULIN GLARGINE 100 UNIT/ML ~~LOC~~ SOLN
40.0000 [IU] | Freq: Every day | SUBCUTANEOUS | Status: DC
  Administered 2015-05-14: 40 [IU] via SUBCUTANEOUS
  Filled 2015-05-14 (×2): qty 0.4

## 2015-05-14 MED ORDER — FENTANYL CITRATE (PF) 100 MCG/2ML IJ SOLN
INTRAMUSCULAR | Status: AC
  Administered 2015-05-14: 100 ug via INTRAVENOUS
  Filled 2015-05-14: qty 2

## 2015-05-14 MED ORDER — SODIUM BICARBONATE 8.4 % IV SOLN
50.0000 meq | Freq: Once | INTRAVENOUS | Status: AC
  Administered 2015-05-14: 50 meq via INTRAVENOUS
  Filled 2015-05-14: qty 50

## 2015-05-14 MED ORDER — PUREFLOW DIALYSIS SOLUTION
INTRAVENOUS | Status: DC
  Administered 2015-05-14 – 2015-05-15 (×3): via INTRAVENOUS_CENTRAL

## 2015-05-14 MED ORDER — POTASSIUM CHLORIDE CRYS ER 20 MEQ PO TBCR: 40.0000 meq | EXTENDED_RELEASE_TABLET | Freq: Once | ORAL | Status: DC

## 2015-05-14 MED ORDER — SODIUM CHLORIDE 0.9 % IV SOLN
200.0000 mg | Freq: Once | INTRAVENOUS | Status: AC
  Administered 2015-05-14: 200 mg via INTRAVENOUS
  Filled 2015-05-14: qty 20

## 2015-05-14 MED ORDER — FENTANYL 2500MCG IN NS 250ML (10MCG/ML) PREMIX INFUSION
0.0000 ug/h | INTRAVENOUS | Status: DC
  Administered 2015-05-14: 25 ug/h via INTRAVENOUS

## 2015-05-14 MED ORDER — DOPAMINE-DEXTROSE 3.2-5 MG/ML-% IV SOLN
0.0000 ug/kg/min | INTRAVENOUS | Status: DC
  Administered 2015-05-14: 6 ug/kg/min via INTRAVENOUS
  Filled 2015-05-14: qty 250

## 2015-05-14 MED ORDER — SODIUM BICARBONATE 8.4 % IV SOLN
INTRAVENOUS | Status: AC
  Administered 2015-05-14: 50 meq
  Filled 2015-05-14: qty 100

## 2015-05-14 MED ORDER — SODIUM BICARBONATE 8.4 % IV SOLN
INTRAVENOUS | Status: AC
  Filled 2015-05-14: qty 50

## 2015-05-14 MED ORDER — FENTANYL 2500MCG IN NS 250ML (10MCG/ML) PREMIX INFUSION
INTRAVENOUS | Status: AC
  Administered 2015-05-14: 25 ug/h via INTRAVENOUS
  Filled 2015-05-14: qty 250

## 2015-05-14 MED ORDER — VANCOMYCIN HCL IN DEXTROSE 1-5 GM/200ML-% IV SOLN
1000.0000 mg | INTRAVENOUS | Status: DC
  Administered 2015-05-14: 1000 mg via INTRAVENOUS
  Filled 2015-05-14 (×2): qty 200

## 2015-05-14 MED ORDER — NOREPINEPHRINE BITARTRATE 1 MG/ML IV SOLN
0.0000 ug/min | INTRAVENOUS | Status: DC
  Administered 2015-05-14: 30 ug/min via INTRAVENOUS
  Administered 2015-05-14: 10 ug/min via INTRAVENOUS
  Administered 2015-05-15 (×2): 40 ug/min via INTRAVENOUS
  Filled 2015-05-14 (×4): qty 16

## 2015-05-14 MED ORDER — FENTANYL CITRATE (PF) 100 MCG/2ML IJ SOLN
100.0000 ug | Freq: Once | INTRAMUSCULAR | Status: AC
  Administered 2015-05-14: 100 ug via INTRAVENOUS

## 2015-05-14 MED ORDER — STERILE WATER FOR INJECTION IV SOLN
INTRAVENOUS | Status: DC
  Administered 2015-05-14: 12:00:00 via INTRAVENOUS
  Filled 2015-05-14 (×8): qty 850

## 2015-05-14 MED ORDER — CHLORHEXIDINE GLUCONATE 0.12% ORAL RINSE (MEDLINE KIT)
15.0000 mL | Freq: Two times a day (BID) | OROMUCOSAL | Status: DC
  Administered 2015-05-14 – 2015-05-15 (×4): 15 mL via OROMUCOSAL
  Filled 2015-05-14 (×6): qty 15

## 2015-05-14 NOTE — Progress Notes (Signed)
ANTIBIOTIC CONSULT NOTE - INITIAL  Pharmacy Consult for vancomycin and piperacillin/tazobactam Indication: Sepsis/septic shock  No Known Allergies  Patient Measurements: Height: 6' (182.9 cm) Weight: 220 lb (99.791 kg) IBW/kg (Calculated) : 77.6 Adjusted Body Weight: 86.6 kg  Vital Signs: Temp: 100.2 F (37.9 C) (09/15 0915) Temp Source: Other (Comment) (09/15 0600) BP: 115/65 mmHg (09/15 0915) Pulse Rate: 102 (09/15 0915) Intake/Output from previous day: 09/14 0701 - 09/15 0700 In: 3239.9 [I.V.:3039.9; IV Piggyback:200] Out: 250 [Urine:250] Intake/Output from this shift: Total I/O In: 1256.9 [I.V.:1256.9] Out: 100 [Urine:100]  Labs:  Recent Labs  05/12/2015 1504 05/11/2015 2042 05/14/15 0204 05/14/15 0443  WBC 28.4*  --   --   --   HGB 13.8  --   --   --   PLT 160  --   --   --   CREATININE 3.78* 3.86* 4.11* 4.33*   Estimated Creatinine Clearance: 25 mL/min (by C-G formula based on Cr of 4.33). No results for input(s): VANCOTROUGH, VANCOPEAK, VANCORANDOM, GENTTROUGH, GENTPEAK, GENTRANDOM, TOBRATROUGH, TOBRAPEAK, TOBRARND, AMIKACINPEAK, AMIKACINTROU, AMIKACIN in the last 72 hours.   Microbiology: Recent Results (from the past 720 hour(s))  Urine culture     Status: None (Preliminary result)   Collection Time: 05/04/2015  3:52 PM  Result Value Ref Range Status   Specimen Description URINE, RANDOM  Final   Special Requests NONE  Final   Culture NO GROWTH  Final   Report Status PENDING  Incomplete  MRSA PCR Screening     Status: None   Collection Time: 05/14/15  2:48 AM  Result Value Ref Range Status   MRSA by PCR NEGATIVE NEGATIVE Final    Comment:        The GeneXpert MRSA Assay (FDA approved for NASAL specimens only), is one component of a comprehensive MRSA colonization surveillance program. It is not intended to diagnose MRSA infection nor to guide or monitor treatment for MRSA infections.     Medical History: Past Medical History  Diagnosis Date   . SOB (shortness of breath)   . Tachycardia   . Chest pain   . Hypertension   . Diabetes     Medications:  Anti-infectives    Start     Dose/Rate Route Frequency Ordered Stop   May 17, 2015 1500  vancomycin (VANCOCIN) 1,500 mg in sodium chloride 0.9 % 500 mL IVPB  Status:  Discontinued     1,500 mg 250 mL/hr over 120 Minutes Intravenous Every 48 hours 05/25/2015 1954 05/14/15 1105   05/14/15 2100  vancomycin (VANCOCIN) IVPB 1000 mg/200 mL premix     1,000 mg 200 mL/hr over 60 Minutes Intravenous Every 24 hours 05/14/15 1105     05/06/2015 2100  micafungin (MYCAMINE) 100 mg in sodium chloride 0.9 % 100 mL IVPB     100 mg 100 mL/hr over 1 Hours Intravenous Daily 05/24/2015 2056     05/11/2015 2045  anidulafungin (ERAXIS) 200 mg in sodium chloride 0.9 % 200 mL IVPB  Status:  Discontinued     200 mg over 180 Minutes Intravenous Every 24 hours 05/21/2015 2034 05/17/2015 2057   05/09/2015 2000  vancomycin (VANCOCIN) 2,000 mg in sodium chloride 0.9 % 500 mL IVPB     2,000 mg 250 mL/hr over 120 Minutes Intravenous  Once 05/17/2015 1929 05/11/2015 2353   05/01/2015 1930  piperacillin-tazobactam (ZOSYN) IVPB 3.375 g     3.375 g 12.5 mL/hr over 240 Minutes Intravenous 3 times per day 05/22/2015 1913       Assessment:  Patient found unresponsive and admitted with DKA in septic shock. Pharmacy consulted to dose vancomycin and piperacillin/tazobactam.   Goal of Therapy:  Goal vancomycin trough: 15-25 mcg/ml  Plan:  Patient to start CRRT so will continue Zosyn 3.375 g EI q 8 hours and adjust vancomycin to 1000 mg iv q 24 hours. Will check a vancomycin trough prior to the third dose. Will f/u culture results.  Luis Perry D 05/14/2015,11:10 AM

## 2015-05-14 NOTE — Progress Notes (Signed)
Diabetes coordinator called to check on patient and progression of insulin drip.  Diabetes coordinator requested nurse to stop insulin drip for 30 minutes and then she would come to bedside to assist with restarting insulin drip.

## 2015-05-14 NOTE — Consult Note (Signed)
Date: 05/14/2015                  Patient Name:  Luis Perry  MRN: [id number]  DOB: 1965-09-15  Age / Sex: 49 y.o., male         PCP: No primary care provider on file.                 Service Requesting Consult: Internal medicine, critical care                 Reason for Consult: ARF            History of Present Illness: Patient is a 49 y.o. 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  His family reports that he has been sick since Sunday. He vomited and thought he had a "stomach virus". He was was weak and stayed in bed most day prior to admission. When he became lethargic, his fiance decided to bring him to the hospital. Here he was found to have severely elevated blood sugar. He was noted to be severely acidotic and has acute renal failure Baseline creatinine is unavailable but last known creatinine is from July 2011 is 1.35 Admission creatinine is 3.78 and has progressively worsened to 4.33 today Nephrology  Consult requested for evaluation for CRRT    Medications: Outpatient medications: Prescriptions prior to admission  Medication Sig Dispense Refill Last Dose  . oxyCODONE (OXY IR/ROXICODONE) 5 MG immediate release tablet Take 5-10 mg by mouth every 4 (four) hours as needed for severe pain.    PRN at PRN    Current medications: Current Facility-Administered Medications  Medication Dose Route Frequency Provider Last Rate Last Dose  . 0.9 %  sodium chloride infusion  1,000 mL Intravenous Once Darien Ramus, MD   1,000 mL at 23-May-2015 1835  . 0.9 %  sodium chloride infusion   Intravenous Continuous Adrian Saran, MD 150 mL/hr at 05/14/15 0620    . 0.9 %  sodium chloride infusion   Intravenous Continuous Shane Crutch, MD      . antiseptic oral rinse solution (CORINZ)  7 mL Mouth Rinse QID Shane Crutch, MD   7 mL at 05/14/15 0419   . chlorhexidine gluconate (PERIDEX) 0.12 % solution 15 mL  15 mL Mouth Rinse BID Shane Crutch, MD   15 mL at 05/14/15 0140  . dextrose 5 %-0.45 % sodium chloride infusion   Intravenous Continuous Shane Crutch, MD      . docusate sodium (COLACE) capsule 100 mg  100 mg Oral BID PRN Shane Crutch, MD      . DOPamine (INTROPIN) 800 mg in dextrose 5 % 250 mL (3.2 mg/mL) infusion  5 mcg/kg/min Intravenous Continuous Nelda Bucks, MD 15 mL/hr at 05/23/2015 2048 8 mcg/kg/min at May 23, 2015 2048  . enoxaparin (LOVENOX) injection 30 mg  30 mg Subcutaneous Q24H Sital Mody, MD      . fentaNYL (SUBLIMAZE) bolus via infusion 50 mcg  50 mcg Intravenous Q1H PRN Nelda Bucks, MD      . fentaNYL (SUBLIMAZE) injection 100 mcg  100 mcg Intravenous Q15 min PRN Shane Crutch, MD   100 mcg at 05/14/15 0536  . fentaNYL (SUBLIMAZE) injection 100 mcg  100 mcg Intravenous Q2H PRN Shane Crutch, MD   100 mcg at 05/14/15 0416  . fentaNYL (SUBLIMAZE) injection 50 mcg  50 mcg  Intravenous Once Nelda Bucks, MD   50 mcg at 05/17/2015 2230  . fentaNYL in NS (86mcg/ml) infusion-PREMIX  25-400 mcg/hr Intravenous Continuous Nelda Bucks, MD   25 mcg/hr at 05/26/2015 2230  . hydrocortisone sodium succinate (SOLU-CORTEF) 100 MG injection 50 mg  50 mg Intravenous Q6H Shane Crutch, MD   50 mg at 05/14/15 0625  . insulin regular (NOVOLIN R,HUMULIN R) 250 Units in sodium chloride 0.9 % 250 mL (1 Units/mL) infusion   Intravenous Continuous Shane Crutch, MD 50 mL/hr at 05/14/15 0642 50 Units/hr at 05/14/15 0642  . iohexol (OMNIPAQUE) 240 MG/ML injection 50 mL  50 mL Oral Once PRN Medication Radiologist, MD      . micafungin (MYCAMINE) 100 mg in sodium chloride 0.9 % 100 mL IVPB  100 mg Intravenous Daily Nelda Bucks, MD   100 mg at 05/12/2015 2117  . midazolam (VERSED) injection 2 mg  2 mg Intravenous Q15 min PRN Nelda Bucks, MD      . midazolam  (VERSED) injection 2 mg  2 mg Intravenous Q2H PRN Nelda Bucks, MD      . norepinephrine (LEVOPHED)  in D5W premix infusion  0-40 mcg/min Intravenous Titrated Adrian Saran, MD 337.5 mL/hr at 05/14/15 0635 90 mcg/min at 05/14/15 0635  . pantoprazole (PROTONIX) injection 40 mg  40 mg Intravenous Q24H Shane Crutch, MD   40 mg at 05/05/2015 2054  . piperacillin-tazobactam (ZOSYN) IVPB 3.375 g  3.375 g Intravenous 3 times per day Adrian Saran, MD   3.375 g at 05/14/15 0358  . sodium bicarbonate 1 mEq/mL injection           . sodium bicarbonate 1 mEq/mL injection           . sodium bicarbonate 150 mEq in dextrose 5 % 1,000 mL infusion   Intravenous Continuous Erin Fulling, MD 125 mL/hr at 05/14/15 1610    . [START ON 2015/06/07] vancomycin (VANCOCIN) 1,500 mg in sodium chloride 0.9 % 500 mL IVPB  1,500 mg Intravenous Q48H Sital Mody, MD      . vasopressin (PITRESSIN) 40 Units in sodium chloride 0.9 % 250 mL (0.16 Units/mL) infusion  0.03 Units/min Intravenous Continuous Shane Crutch, MD 11.3 mL/hr at 05/14/2015 2025 0.03 Units/min at 05/20/2015 2025      Allergies: No Known Allergies    Past Medical History: Past Medical History  Diagnosis Date  . SOB (shortness of breath)   . Tachycardia   . Chest pain   . Hypertension   . Diabetes      Past Surgical History: No past surgical history on file.   Family History: Family History  Problem Relation Age of Onset  . Coronary artery disease Other   . Hypertension Other      Social History: Social History   Social History  . Marital Status: Single    Spouse Name: N/A  . Number of Children: N/A  . Years of Education: N/A   Occupational History  . [employer]     full time   Social History Main Topics  . Smoking status: Unknown If Ever Smoked  . Smokeless tobacco: Not on file  . Alcohol Use: 3.0 oz/week    6 drink(s) per week     Comment: 6 pack a week  . Drug Use: No  . Sexual Activity: Not on  file   Other Topics Concern  . Not on file   Social History Narrative   Single  Gets regular exercise: walking   Patient lives with Fiance. No biological children Sister - Ms Zella Ball Mother   Review of Systems: not available as patient is critically ill, intubated and sedated   Vital Signs: Blood pressure 143/66, pulse 111, temperature 100.2 F (37.9 C), temperature source Other (Comment), resp. rate 35, height 6' (1.829 m), weight 99.791 kg (220 lb), SpO2 100 %.   Intake/Output Summary (Last 24 hours) at 05/14/15 0859 Last data filed at 05/14/15 1610  Gross per 24 hour  Intake 3239.92 ml  Output    250 ml  Net 2989.92 ml    Weight trends: Filed Weights   05/03/2015 1522  Weight: 99.791 kg (220 lb)    Physical Exam: General:  critically ill appearing  HEENT Pupils are round 3-4 mm, leftward gaze  Neck:  no masses  Lungs: Tachypneic, clear to auscultation, vent assisted  Heart::  regular, tachycardic  Abdomen: Soft, non tender, ND  Extremities:  no peripheral edema  Neurologic: sedated  Skin: No acute rashes  Access: To be placed  Foley: present       Lab results: Basic Metabolic Panel:  Recent Labs Lab 05/10/2015 2042 05/14/15 0204 05/14/15 0443  NA 145 149* 148*  K 5.5* 4.9 3.7  CL 116* 113* 112*  CO2 6* 12* 9*  GLUCOSE 671* 707* 796*  BUN 29* 29* 33*  CREATININE 3.86* 4.11* 4.33*  CALCIUM 6.6* 6.4* 6.4*    Liver Function Tests:  Recent Labs Lab May 15, 2015 1504  AST 52*  ALT 22  ALKPHOS 149*  BILITOT 1.3*  PROT 8.5*  ALBUMIN 4.6   No results for input(s): LIPASE, AMYLASE in the last 168 hours.  Recent Labs Lab May 15, 2015 1640  AMMONIA 283*    CBC:  Recent Labs Lab 05/13/2015 1504  WBC 28.4*  NEUTROABS 20.2*  HGB 13.8  HCT 48.8  MCV 105.7*  PLT 160    Cardiac Enzymes:  Recent Labs Lab 05/14/15 0443  TROPONINI 0.07*    BNP: Invalid input(s): POCBNP  CBG:  Recent Labs Lab 05/14/15 0404 05/14/15 0529 05/14/15 0625  05/14/15 0654 05/14/15 0806  GLUCAP >600* >600* >600* >600* >600*    Microbiology: Recent Results (from the past 720 hour(s))  MRSA PCR Screening     Status: None   Collection Time: 05/14/15  2:48 AM  Result Value Ref Range Status   MRSA by PCR NEGATIVE NEGATIVE Final    Comment:        The GeneXpert MRSA Assay (FDA approved for NASAL specimens only), is one component of a comprehensive MRSA colonization surveillance program. It is not intended to diagnose MRSA infection nor to guide or monitor treatment for MRSA infections.      Coagulation Studies:  Recent Labs  May 15, 2015 1504 15-May-2015 1925  LABPROT 19.0* 22.5*  INR 1.57 1.96    Urinalysis:  Recent Labs  May 15, 2015 1504 05/16/2015 2320  COLORURINE STRAW* AMBER*  LABSPEC 1.018 1.014  PHURINE 5.0 5.0  GLUCOSEU >500* >500*  HGBUR 2+* 3+*  BILIRUBINUR NEGATIVE NEGATIVE  KETONESUR 2+* TRACE*  PROTEINUR 100* >500*  NITRITE NEGATIVE NEGATIVE  LEUKOCYTESUR NEGATIVE NEGATIVE      Imaging: Dg Abd 1 View  05/14/2015   CLINICAL DATA:  OG tube placement.  EXAM: ABDOMEN - 1 VIEW  COMPARISON:  None.  FINDINGS: Tip of the enteric tube is in the distal stomach. The side port is below the diaphragm. No dilated bowel loops to suggest obstruction. There is mild motion artifact.  IMPRESSION: Tip  and side port of the enteric tube below the diaphragm in the stomach.   Electronically Signed   By: Rubye Oaks M.D.   On: 05/14/2015 06:32   Ct Head Wo Contrast  05/08/2015   CLINICAL DATA:  Unresponsive  EXAM: CT HEAD WITHOUT CONTRAST  TECHNIQUE: Contiguous axial images were obtained from the base of the skull through the vertex without intravenous contrast.  COMPARISON:  None.  FINDINGS: There is no midline shift, hydrocephalus, or mass. No acute hemorrhage or acute transcortical infarct is identified. There is left maxillary sinus retention cyst. Mild mucoperiosteal thickening of the right ethmoid sinuses identified. The bony  calvarium is intact.  IMPRESSION: No focal acute intracranial abnormality identified.   Electronically Signed   By: Sherian Rein M.D.   On: 05/24/2015 16:05   Dg Chest Port 1 View  05/08/2015   CLINICAL DATA:  Acute respiratory failure with hypoxia. Central line placement.  EXAM: PORTABLE CHEST - 1 VIEW  COMPARISON:  05/12/2015  FINDINGS: New left jugular central venous catheter is seen with tip overlying the superior cavoatrial junction. Endotracheal tube remains in appropriate position. No pneumothorax visualized. Low lung volumes are seen however both lungs remain clear. Heart size is within normal limits.  IMPRESSION: New left jugular central venous catheter in appropriate position. No pneumothorax visualized.  Low lung volumes.  No active lung disease.   Electronically Signed   By: Myles Rosenthal M.D.   On: 05/26/2015 19:56   Dg Chest Port 1 View  05/06/2015   CLINICAL DATA:  Altered mental status.  Intubation.  EXAM: PORTABLE CHEST - 1 VIEW  COMPARISON:  Chest x-ray dated 02/22/2010  FINDINGS: Endotracheal tube is in good position. Heart size and pulmonary vascularity are normal. Lungs are clear. No acute osseous abnormality.  IMPRESSION: Endotracheal tube in good position.  No visible acute abnormalities.   Electronically Signed   By: Francene Boyers M.D.   On: 05/02/2015 15:48      Assessment & Plan: Pt is a 49 y.o. yo male with a PMHX of Newly diagnosed DM, recent rt hip surgery (july 2016), was admitted on 05/09/2015 with DKA, ARF, Acute resp failure.   1. ARF, likely ATN from severe concurrent illness. Oliguric 2. Severe lactic acidosis ? Related to acute illness, leading to ARF but patient kept taking metformin which might have led to this illness 3. Acute resp failure - vent assisted 4. Hypernatremia 5. DKA  Plan: Urgent CRRT to correct acidemia (consent obtained from patient's mother) Leftward gaze is concerning Will d./c bicarb drip 2 hrs after starting CRRT No UF at  present Will follow

## 2015-05-14 NOTE — Progress Notes (Signed)
eLink Physician-Brief Progress Note Patient Name: Luis Perry DOB: 1966/01/17 MRN: [id number]   Date of Service  05/14/2015  HPI/Events of Note  Severe metabolic acidosis, shock  eICU Interventions  Given several amps bicarb, increased bicarb infusion to 145ml/hr     Intervention Category Major Interventions: Acid-Base disturbance - evaluation and management  Erin Fulling 05/14/2015, 1:29 AM

## 2015-05-14 NOTE — Progress Notes (Signed)
Spoke with Dr. Sherene Sires via elink concerning pt's hr which dipped down to 52 and bp also decreasing in spite of the increase of levophed.  Per Dr. Sherene Sires, start pt on dopamine drip.  Also, most recent ABG discussed.  Per Dr Sherene Sires, respiratory will change RR on vent to 16.  Will continue to monitor.

## 2015-05-14 NOTE — Progress Notes (Signed)
Activase indwelled for one hour.  Left femoral flushes but does not give adequate blood return.  Dr. Dyke Maes at bedside and attempted to manipulate line with no success. Orders received to set up for new hemodialysis access.

## 2015-05-14 NOTE — Progress Notes (Signed)
eLink Physician-Brief Progress Note Patient Name: Luis Perry DOB: 1966/01/03 MRN: [id number]   Date of Service  05/14/2015  HPI/Events of Note  Bradycardic on levophed p d/c dopamine  eICU Interventions  Ok to resume  dopamine      Intervention Category Major Interventions: Shock - evaluation and management  Sandrea Hughs 05/14/2015, 7:36 PM

## 2015-05-14 NOTE — Progress Notes (Signed)
Luis Perry from [facility] called for update on pt.

## 2015-05-14 NOTE — Progress Notes (Signed)
Patient ID: Luis Perry, male   DOB: 13-Sep-1965, 49 y.o.   MRN: [id number] [facility] Physicians PROGRESS NOTE  PCP: No primary care provider on file.  HPI/Subjective: Patient unresponsive on the ventilator without sedation.  Objective: Filed Vitals:   05/14/15 1400  BP: 104/58  Pulse: 100  Temp: 99.3 F (37.4 C)  Resp: 25    Filed Weights   05/24/2015 1522  Weight: 99.791 kg (220 lb)    ROS: Review of Systems  Unable to perform ROS  patient unresponsive Exam: Physical Exam  HENT:  Nose: No mucosal edema.  Eyes: Conjunctivae, EOM and lids are normal.  Pupils pinpoint  Neck: No JVD present. Carotid bruit is not present. No edema present. No thyroid mass and no thyromegaly present.  Cardiovascular: S1 normal and S2 normal.  Exam reveals no gallop.   No murmur heard. Pulses:      Dorsalis pedis pulses are 2+ on the right side, and 2+ on the left side.  Respiratory: No respiratory distress. He has no wheezes. He has rhonchi in the right lower field and the left lower field. He has no rales.  GI: Soft. Bowel sounds are normal. There is no tenderness.  Musculoskeletal:       Right ankle: He exhibits swelling.       Left ankle: He exhibits swelling.  Lymphadenopathy:    He has no cervical adenopathy.  Neurological: He is unresponsive.  Unresponsive to painful stimuli  Skin: Skin is warm. Nails show no clubbing.  Fungal rash in the groin  Psychiatric:  Unresponsive to painful stimuli    Data Reviewed: Basic Metabolic Panel:  Recent Labs Lab 05/28/2015 2042 05/14/15 0204 05/14/15 0443 05/14/15 0831 05/14/15 1020 05/14/15 1340  NA 145 149* 148*  --  143 144  K 5.5* 4.9 3.7 3.1* 2.7* 2.4*  CL 116* 113* 112*  --  111 110  CO2 6* 12* 9*  --  14* 14*  GLUCOSE 671* 707* 796*  --  898* 814*  BUN 29* 29* 33*  --  38* 37*  CREATININE 3.86* 4.11* 4.33*  --  5.19* 5.41*  CALCIUM 6.6* 6.4* 6.4*  --  6.6* 6.6*  PHOS  --   --   --   --  <1.0* <1.0*   Liver  Function Tests:  Recent Labs Lab 05/10/2015 1504 05/14/15 1020 05/14/15 1340  AST 52*  --   --   ALT 22  --   --   ALKPHOS 149*  --   --   BILITOT 1.3*  --   --   PROT 8.5*  --   --   ALBUMIN 4.6 2.4* 2.4*    Recent Labs Lab 05/05/2015 1640  AMMONIA 283*   CBC:  Recent Labs Lab 05/12/2015 1504 05/14/15 1421  WBC 28.4* 8.8  NEUTROABS 20.2*  --   HGB 13.8 11.0*  HCT 48.8 33.0*  MCV 105.7* 89.1  PLT 160 74*   Cardiac Enzymes:  Recent Labs Lab 05/09/2015 1504 05/01/2015 1925 05/14/15 0204 05/14/15 0443 05/14/15 1020  TROPONINI <0.03 <0.03 0.04* 0.07* 0.23*    CBG:  Recent Labs Lab 05/14/15 1125 05/14/15 1221 05/14/15 1332 05/14/15 1407 05/14/15 1523  GLUCAP >600* >600* >600* >600* 560*    Recent Results (from the past 240 hour(s))  Urine culture     Status: None (Preliminary result)   Collection Time: 05/08/2015  3:52 PM  Result Value Ref Range Status   Specimen Description URINE, RANDOM  Final  Special Requests NONE  Final   Culture NO GROWTH  Final   Report Status PENDING  Incomplete  Culture, blood (routine x 2)     Status: None (Preliminary result)   Collection Time: 17-May-2015  6:08 PM  Result Value Ref Range Status   Specimen Description BLOOD RIGHT ASSIST CONTROL  Final   Special Requests BOTTLES DRAWN AEROBIC AND ANAEROBIC  7CC  Final   Culture NO GROWTH < 24 HOURS  Final   Report Status PENDING  Incomplete  Culture, blood (routine x 2)     Status: None (Preliminary result)   Collection Time: 17-May-2015  6:34 PM  Result Value Ref Range Status   Specimen Description BLOOD RIGHT ASSIST CONTROL  Final   Special Requests BOTTLES DRAWN AEROBIC AND ANAEROBIC  7CC  Final   Culture NO GROWTH < 24 HOURS  Final   Report Status PENDING  Incomplete  MRSA PCR Screening     Status: None   Collection Time: 05/14/15  2:48 AM  Result Value Ref Range Status   MRSA by PCR NEGATIVE NEGATIVE Final    Comment:        The GeneXpert MRSA Assay (FDA approved for NASAL  specimens only), is one component of a comprehensive MRSA colonization surveillance program. It is not intended to diagnose MRSA infection nor to guide or monitor treatment for MRSA infections.      Studies: Dg Abd 1 View  05/14/2015   CLINICAL DATA:  OG tube placement.  EXAM: ABDOMEN - 1 VIEW  COMPARISON:  None.  FINDINGS: Tip of the enteric tube is in the distal stomach. The side port is below the diaphragm. No dilated bowel loops to suggest obstruction. There is mild motion artifact.  IMPRESSION: Tip and side port of the enteric tube below the diaphragm in the stomach.   Electronically Signed   By: Rubye Oaks M.D.   On: 05/14/2015 06:32   Ct Head Wo Contrast  17-May-2015   CLINICAL DATA:  Unresponsive  EXAM: CT HEAD WITHOUT CONTRAST  TECHNIQUE: Contiguous axial images were obtained from the base of the skull through the vertex without intravenous contrast.  COMPARISON:  None.  FINDINGS: There is no midline shift, hydrocephalus, or mass. No acute hemorrhage or acute transcortical infarct is identified. There is left maxillary sinus retention cyst. Mild mucoperiosteal thickening of the right ethmoid sinuses identified. The bony calvarium is intact.  IMPRESSION: No focal acute intracranial abnormality identified.   Electronically Signed   By: Sherian Rein M.D.   On: 05/17/15 16:05   Dg Chest Port 1 View  05-17-2015   CLINICAL DATA:  Acute respiratory failure with hypoxia. Central line placement.  EXAM: PORTABLE CHEST - 1 VIEW  COMPARISON:  05-17-15  FINDINGS: New left jugular central venous catheter is seen with tip overlying the superior cavoatrial junction. Endotracheal tube remains in appropriate position. No pneumothorax visualized. Low lung volumes are seen however both lungs remain clear. Heart size is within normal limits.  IMPRESSION: New left jugular central venous catheter in appropriate position. No pneumothorax visualized.  Low lung volumes.  No active lung disease.    Electronically Signed   By: Myles Rosenthal M.D.   On: 05-17-2015 19:56   Dg Chest Port 1 View  05-17-15   CLINICAL DATA:  Altered mental status.  Intubation.  EXAM: PORTABLE CHEST - 1 VIEW  COMPARISON:  Chest x-ray dated 02/22/2010  FINDINGS: Endotracheal tube is in good position. Heart size and pulmonary vascularity are normal. Lungs  are clear. No acute osseous abnormality.  IMPRESSION: Endotracheal tube in good position.  No visible acute abnormalities.   Electronically Signed   By: Francene Boyers M.D.   On: 05/14/2015 15:48    Scheduled Meds: . antiseptic oral rinse  7 mL Mouth Rinse QID  . chlorhexidine gluconate  15 mL Mouth Rinse BID  . hydrocortisone sod succinate (SOLU-CORTEF) inj  50 mg Intravenous Q12H  . lacosamide (VIMPAT) IV  100 mg Intravenous Q12H  . lacosamide (VIMPAT) IV  200 mg Intravenous Once  . micafungin [facility]) IV  100 mg Intravenous Daily  . pantoprazole (PROTONIX) IV  40 mg Intravenous Q24H  . piperacillin-tazobactam (ZOSYN)  IV  3.375 g Intravenous 3 times per day  . vancomycin  1,000 mg Intravenous Q24H   Continuous Infusions: . sodium chloride    . insulin (NOVOLIN-R) infusion 10.8 Units/hr (05/14/15 1428)  . norepinephrine (LEVOPHED) Adult infusion 10 mcg/min (05/14/15 1215)  . pureflow 2,000 mL/hr at 05/14/15 1115  .  sodium bicarbonate 150 mEq in sterile water 1000 mL infusion 200 mL/hr at 05/14/15 1135  . vasopressin (PITRESSIN) infusion - *FOR SHOCK* Stopped (05/14/15 1215)    Assessment/Plan:  1. Shock. Septic versus hypovolemic. Continue IV fluid hydration and a lever fed and monitor blood pressure. Empiric antibiotics at this time not sure what we are treating. Stress dose steroids. 2. Lactic acidosis and severe acidosis- unclear if this is septic shock or complication of being on metformin. Patient is on the ventilator and dialysis to be done. Bicarbonate drip. 3. Oliguric acute renal failure with worsening creatinine- continue his dialysis to be  started today 4. Diabetic ketoacidosis with severely high sugars- patient is on insulin drip. Appreciate endocrine consultation. 5. Acute encephalopathy- could be from severe acidosis and diabetic ketoacidosis. Neurology started on seizure medication just in case. EEG ordered for tomorrow. 6. DIC profile sent off since dialysis catheter clotted and platelet count dropped 7. Hypokalemia- electrolyte protocol.  Code Status:     Code Status Orders        Start     Ordered   05/01/2015 1812  Full code   Continuous     05/12/2015 1829     Family Communication: Fiance at bedside Disposition Plan: To be determined  Consultants:  Critical care specialist  Nephrology  Endocrinology  Neurology  Antibiotics:  Vancomycin  Zosyn  Micafungin  Time spent: 35 minutes critical care time  Alford Highland  [facility] Hospitalists

## 2015-05-14 NOTE — Progress Notes (Signed)
Inpatient Diabetes Program Recommendations  AACE/ADA: New Consensus Statement on Inpatient Glycemic Control (2015)  Target Ranges:  Prepandial:   less than 140 mg/dL      Peak postprandial:   less than 180 mg/dL (1-2 hours)      Critically ill patients:  140 - 180 mg/dL   Review of Glycemic Control  IV Glucostabilizer stopped for 30 minutes based on order /protocol  (blood glucose greater than /dl and/or rate greater than 30 units /hour) and restarted using the initial multiplier of 0.01.  Susette Racer, RN, [place], [place], CDE Diabetes Coordinator Inpatient Diabetes Program  [phone] (Team Pager) [phone] [facility] Office) 05/14/2015 3:43 PM

## 2015-05-14 NOTE — Progress Notes (Signed)
Blood draw for labs sent prior to this note.  Lab called one time to make sure blood was received. Per Cecily, blood received.   At this time still no results.  Lab called again. Per Joe,  Blood now cannot be found.  Lab draw repeated and sent to lab at this time.

## 2015-05-14 NOTE — Progress Notes (Signed)
Initial Nutrition Assessment       INTERVENTION: EN: Discussed in ICU rounds, will reassess nutrition in am   NUTRITION DIAGNOSIS:   Inadequate oral intake related to acute illness as evidenced by NPO status.    GOAL:   Provide needs based on ASPEN/SCCM guidelines    MONITOR:    (Energy intake, Electrolyte and renal profile, Anthropometrics)  REASON FOR ASSESSMENT:   Ventilator    ASSESSMENT:      Pt admitted unresponsive, DKA, intubated, ARF. Starting CRRT today  Past Medical History  Diagnosis Date  . SOB (shortness of breath)   . Tachycardia   . Chest pain   . Hypertension   . Diabetes     Current Nutrition: NPO  Food/Nutrition-Related History: Per chart noted poor po intake for 4-5 days prior to admission secondary to possible stomach bug  Medications: Na bicarbonate, insulin drip, levophed, vasopressin  Electrolyte/Renal Profile and Glucose Profile:   Recent Labs Lab 05/14/15 0204 05/14/15 0443 05/14/15 0831 05/14/15 1020  NA 149* 148*  --  143  K 4.9 3.7 3.1* 2.7*  CL 113* 112*  --  111  CO2 12* 9*  --  14*  BUN 29* 33*  --  38*  CREATININE 4.11* 4.33*  --  5.19*  CALCIUM 6.4* 6.4*  --  6.6*  PHOS  --   --   --  <1.0*  GLUCOSE 707* 796*  --  898*   Protein Profile:  Recent Labs Lab 05/03/2015 1504 05/14/15 1020  ALBUMIN 4.6 2.4*    Gastrointestinal Profile: Last BM: PTA   Nutrition-Focused Physical Exam Findings:  Unable to complete Nutrition-Focused physical exam at this time.    Weight Change: unsure    Diet Order:   NPO  Skin:   reviewed     Height:   Ht Readings from Last 1 Encounters:  05/05/2015 6' (1.829 m)    Weight:   Wt Readings from Last 1 Encounters:  05/05/2015 220 lb (99.791 kg)     BMI:  Body mass index is 29.83 kg/(m^2).  Estimated Nutritional Needs:   Kcal:  Using I00kg (11-14 kcals/kg) 1100-1400 kcals/d.  Protein:  Using 81kg IBW (2.0-2.5 g/kg) 162-202 g/d  Fluid:  Using IBW of 81kg  (20-85ml/kg) 1620-2027ml/d  EDUCATION NEEDS:   No education needs identified at this time  HIGH Care Level  Cabrina Shiroma B. Freida Busman, RD, LDN [phone] (pager)

## 2015-05-14 NOTE — Progress Notes (Signed)
Dr. Sung Amabile cleared right vascular catheter for use.

## 2015-05-14 NOTE — Progress Notes (Signed)
Received critical values of potassium 2.7, glucose of 898, and phosphorus <1. Reported these results to nurse caring for pt, Mellissa Kohut, RN

## 2015-05-14 NOTE — Procedures (Signed)
PROCEDURE NOTE: L FEMORAL HD CATH PLACEMENT  INDICATION:    Need for CRRT  CONSENT:   Risks of procedure as well as the alternatives were explained to the patient or surrogate. Consent for procedure obtained. A time out was performed.   PROCEDURE  Sterile technique was used including antiseptics, cap, gloves, gown, hand hygiene, mask and full body sheet.  Skin prep: Chlorhexidine; local anesthetic administered  A triple lumen catheter was placed in the L femoral vein using the Seldinger technique.  Ultrasound was used for vessel identification and guidance.   EVALUATION:  Blood flow good  Complications: No apparent complications  Patient tolerated the procedure well.    Billy Fischer, MD PCCM service Mobile [phone]   ADD: catheter malfunctioned after approx 30 mins of HD, apparently due to kinking of cath  Billy Fischer, MD PCCM service Mobile [phone] Pager [phone]

## 2015-05-14 NOTE — Progress Notes (Addendum)
At the beginning of the shift, e link was notified concerning pt's bp.  Vasopressin was started as well as dopamine to reach a map of 65.  Dr. Tyson Alias was in contact concerning pt's progress.  He was also able to speak with the family concerning code status but as of now, family wants to maintain full code status.  Dr. Ardyth Man also assessed pt on site.  Dr. Belia Heman was also notified concerning bp, per Dr. Belia Heman, levophed could be titrated without ceiling.  Issues with lab presented this evening concerning timeliness of return values.  As of now, lab values for the morning have yet to be reported.  Multiple calls were placed and charge nurse is aware.  [facility] were contacted. Dione Lanae Boast was the person who took the initial report and a referral number of [id number] was given.  Aundra Millet from [place] did return a call stating that someone from [place] will be arriving to do a chart review.  Pharmacy was notified concerning insulin drip.  Pump now set to 50 units/hr due to current guardrails.

## 2015-05-14 NOTE — Progress Notes (Signed)
*  PRELIMINARY RESULTS* Echocardiogram 2D Echocardiogram has been performed.  Luis Perry 05/14/2015, 2:59 PM

## 2015-05-14 NOTE — Progress Notes (Signed)
PULMONARY / CRITICAL CARE MEDICINE   Name: Luis Perry MRN: [id number] DOB: 1965/10/16    ADMISSION DATE:  2015-05-25 CONSULTATION DATE:  9/14   INITIAL PRESENTATION:  31 M recently diagnosed with DM, treated with metformin admitted from ED with depressed LOC, shock, MODS, severe hyperglycemia, DKA, severe lactic acidosis, suspected sepsis. Intubated in ED. Admitted to ICU on vent, high dose vasopressors, HCO3 gtt, broad spectrum abx  MAJOR EVENTS/TEST RESULTS: 9/14 admitted as above 9/14 PCCM consult 9/14 CT head: NAD 9/15 Renal consult, CRRT initated 9/15 Echocardiogram:  9/15 Neuro consult:  9/15 EEG:   INDWELLING DEVICES:: ETT 9/14 >>  L IJ CVL 9/14 >>  R femoral A-line 9/14 >>  L femoral HD cath 9/15 >>   MICRO DATA: MRSA PCR 9/14 >> NEG Urine 9/14 >>  Blood 9/14 >>  PCT 9/14: 0.77,  9/16:   ANTIMICROBIALS:  Micafungin 9/14 >>  Vanc 9/14 >>  Pip-tazo 9/14 >>    SUBJECTIVE:  RASS -5, not F/C  VITAL SIGNS: Temp:  [89.9 F (32.2 C)-100.6 F (38.1 C)] 99.5 F (37.5 C) (09/15 1200) Pulse Rate:  [73-115] 97 (09/15 1030) Resp:  [16-40] 29 (09/15 1200) BP: (67-155)/(18-75) 111/66 mmHg (09/15 1200) SpO2:  [88 %-100 %] 100 % (09/15 1030) Arterial Line BP: (90-166)/(35-77) 121/64 mmHg (09/15 1200) FiO2 (%):  [40 %-100 %] 40 % (09/15 1203) Weight:  [99.791 kg (220 lb)] 99.791 kg (220 lb) (09/14 1522) HEMODYNAMICS: CVP:  [15 mmHg-17 mmHg] 15 mmHg VENTILATOR SETTINGS: Vent Mode:  [-] PCV FiO2 (%):  [40 %-100 %] 40 % Set Rate:  [16 bmp-35 bmp] 20 bmp Vt Set:  [550 mL-600 mL] 600 mL PEEP:  [5 cmH20-8 cmH20] 5 cmH20 INTAKE / OUTPUT:  Intake/Output Summary (Last 24 hours) at 05/14/15 1224 Last data filed at 05/14/15 1000  Gross per 24 hour  Intake 4496.79 ml  Output    350 ml  Net 4146.79 ml    PHYSICAL EXAMINATION: General:  Comatose, intubated, no sedation Neuro: L downward gaze, no spont movement, no withdrawal from pain HEENT:  NCAT Cardiovascular:  tachy, regular, no M Lungs: Clear Abdomen: Soft, NT, absent BS GU: severe scrotal yeast with maceration of scrotum Ext: BLE mottling, cool, no edema, full femoral pulses  LABS:  CBC  Recent Labs Lab 2015/05/25 1504  WBC 28.4*  HGB 13.8  HCT 48.8  PLT 160   Coag's  Recent Labs Lab 25-May-2015 1504 05/25/2015 1925  APTT  --  62*  INR 1.57 1.96   BMET  Recent Labs Lab 05/14/15 0204 05/14/15 0443 05/14/15 0831 05/14/15 1020  NA 149* 148*  --  143  K 4.9 3.7 3.1* 2.7*  CL 113* 112*  --  111  CO2 12* 9*  --  14*  BUN 29* 33*  --  38*  CREATININE 4.11* 4.33*  --  5.19*  GLUCOSE 707* 796*  --  898*   Electrolytes  Recent Labs Lab 05/14/15 0204 05/14/15 0443 05/14/15 1020  CALCIUM 6.4* 6.4* 6.6*  PHOS  --   --  <1.0*   Sepsis Markers  Recent Labs Lab 05-25-2015 1925 05-25-2015 2150 05/14/15 1020  LATICACIDVEN 17.4* 14.2* 7.8*  PROCALCITON 0.77  --   --    ABG  Recent Labs Lab May 25, 2015 2140 05/14/15 0100 05/14/15 0848  PHART 6.67* 6.85* 7.31*  PCO2ART 24* 22* 19*  PO2ART 240* 152* 165*   Liver Enzymes  Recent Labs Lab 05/25/15 1504 05/14/15 1020  AST 52*  --  ALT 22  --   ALKPHOS 149*  --   BILITOT 1.3*  --   ALBUMIN 4.6 2.4*   Cardiac Enzymes  Recent Labs Lab 05/18/2015 1925 05/14/15 0204 05/14/15 0443  TROPONINI <0.03 0.04* 0.07*   Glucose  Recent Labs Lab 05/14/15 0529 05/14/15 0625 05/14/15 0654 05/14/15 0806 05/14/15 0900 05/14/15 0950  GLUCAP >600* >600* >600* >600* 599* >600*    CXR: NNF   ASSESSMENT / PLAN:  PULMONARY A: VDRF - intubated due to AMS High Ve due to metabolic acidosis P:   Cont full vent support - settings reviewed and/or adjusted Cont vent bundle Daily SBT if/when meets criteria  CARDIOVASCULAR A:  Septic shock P:  CVP goal 10 - 14 MAP goal > 65 mmHg Weaning NE, D/C vasopressin, DC dopamine Decrease hydrocortisone due to refractory hyperglycemia  RENAL A:   AKI Severe lactic  acidosis Hyperkalemia, resolved Hypokalemia due to HCO3 gtt P:   Monitor BMET intermittently Monitor I/Os Correct electrolytes as indicated CRRT per Renal Service DC HCO3 gtt after CRRT initiated  GASTROINTESTINAL A:   Ileus due to shock P:   SUP: IV PPI Holding TFs until shock improves  HEMATOLOGIC A:   Coagulopathy due to sepsis P:  DVT px: SCDs Monitor CBC intermittently Transfuse per usual guidelines DIC panel 9/16  INFECTIOUS A:   Suspected severe sepsis, no clear etiology Leukocytosis P:   Monitor temp, WBC count Micro and abx as above Following PCT  ENDOCRINE A:   Severe DKA Refractory hyperglycemia Suspected relative adrenal insufficiency P:   Cont insulin gtt Decrease hydrocortisone  NEUROLOGIC A:  Coma - suspect septic/metabolic encephalopathy P:   RASS goal: -1, -2 DC sedative/opiates for now   FAMILY: Fiancee and sister updated in detail  CCM time: 17 mins   Billy Fischer, MD PCCM service Mobile [phone] Pager [phone]   05/14/2015, 12:24 PM

## 2015-05-14 NOTE — Procedures (Signed)
PROCEDURE NOTE: R IJ HD CATH PLACEMENT  INDICATION:    Need for CRRT  CONSENT:   Risks of procedure as well as the alternatives were explained to the patient or surrogate. Consent for procedure obtained. A time out was performed.   PROCEDURE  Sterile technique was used including antiseptics, cap, gloves, gown, hand hygiene, mask and full body sheet.  Skin prep: Chlorhexidine; local anesthetic administered  A triple lumen catheter was placed in the R IJ vein using the Seldinger technique.  Ultrasound was used for vessel identification and guidance.   EVALUATION:  Blood flow good  Complications: No apparent complications  Patient tolerated the procedure well.  CXR has been ordered to confirm placement  Billy Fischer, MD PCCM service Mobile [phone]   ADD: catheter malfunctioned after approx 30 mins of HD, apparently due to kinking of cath  Billy Fischer, MD PCCM service Mobile [phone] Pager [phone]

## 2015-05-14 NOTE — Progress Notes (Signed)
Spoke to Dr. Thedore Mins about CRRT and serum C02 of 19.  Telephone order received to d/c bicarb drip and get STAT ABG.

## 2015-05-14 NOTE — Progress Notes (Signed)
E-link notified of sudden drop in BP, unequal pupils, hypothermia, and new onset of bradycardia.  Levophed titrated up to 15 mcg.

## 2015-05-14 NOTE — Progress Notes (Signed)
Patient given fentanyl IV for elevated heart rate, blood pressure, and biting down on ETT.

## 2015-05-14 NOTE — Progress Notes (Signed)
Dr. Thedore Mins notified that CRRT filter clotted off in 45 minutes after initiation.  Nurse informed MD that manual rinse back completed and new filter initiated but machine continues to alarm with high access pressures.  Left femoral access with stringy clots.  Order obtained for activase.

## 2015-05-14 NOTE — Progress Notes (Signed)
Diabetes coordinator at beside drip restarted at 10.8 based off of DKA side bar information. (If BG reads critical high, enter 601-Do not enter 601 more than 3 hours in a row. If BG remains HIGH, restart GlucoStabilizer with original multiplier of 0.01).

## 2015-05-14 NOTE — Progress Notes (Signed)
CRRT (CVVHD) started via new left IJ vascular catheter.

## 2015-05-14 NOTE — Progress Notes (Signed)
eLink Physician-Brief Progress Note Patient Name: Luis Perry DOB: 07/04/1966 MRN: [id number]   Date of Service  05/14/2015  HPI/Events of Note  Becoming more alkalemic with pc02 22   eICU Interventions  Decreased  RR from 20 to 16  Recheck abg's      Intervention Category Major Interventions: Acid-Base disturbance - evaluation and management  Sandrea Hughs 05/14/2015, 7:37 PM

## 2015-05-14 NOTE — Progress Notes (Signed)
PHARMACY - CRITICAL CARE PROGRESS NOTE  Pharmacy Consult for CRRT Medication Adjusment    No Known Allergies  Patient Measurements: Height: 6' (182.9 cm) Weight: 220 lb (99.791 kg) IBW/kg (Calculated) : 77.6   Vital Signs: Temp: 100.2 F (37.9 C) (09/15 0915) Temp Source: Other (Comment) (09/15 0600) BP: 115/65 mmHg (09/15 0915) Pulse Rate: 102 (09/15 0915) Intake/Output from previous day: 09/14 0701 - 09/15 0700 In: 3239.9 [I.V.:3039.9; IV Piggyback:200] Out: 250 [Urine:250] Intake/Output from this shift: Total I/O In: 1256.9 [I.V.:1256.9] Out: 100 [Urine:100] Vent settings for last 24 hours: Vent Mode:  [-] PRVC FiO2 (%):  [40 %-100 %] 40 % Set Rate:  [16 bmp-35 bmp] 35 bmp Vt Set:  [550 mL-600 mL] 600 mL PEEP:  [5 cmH20-8 cmH20] 5 cmH20  Labs:  Recent Labs  05/22/2015 1504 2015/05/22 1925 2015-05-22 2042 05/14/15 0204 05/14/15 0443  WBC 28.4*  --   --   --   --   HGB 13.8  --   --   --   --   HCT 48.8  --   --   --   --   PLT 160  --   --   --   --   APTT  --  62*  --   --   --   INR 1.57 1.96  --   --   --   CREATININE 3.78*  --  3.86* 4.11* 4.33*  ALBUMIN 4.6  --   --   --   --   PROT 8.5*  --   --   --   --   AST 52*  --   --   --   --   ALT 22  --   --   --   --   ALKPHOS 149*  --   --   --   --   BILITOT 1.3*  --   --   --   --    Estimated Creatinine Clearance: 25 mL/min (by C-G formula based on Cr of 4.33).   Recent Labs  05/14/15 0806 05/14/15 0900 05/14/15 0950  GLUCAP >600* 599* >600*    Microbiology: Recent Results (from the past 720 hour(s))  Urine culture     Status: None (Preliminary result)   Collection Time: 05-22-15  3:52 PM  Result Value Ref Range Status   Specimen Description URINE, RANDOM  Final   Special Requests NONE  Final   Culture NO GROWTH  Final   Report Status PENDING  Incomplete  MRSA PCR Screening     Status: None   Collection Time: 05/14/15  2:48 AM  Result Value Ref Range Status   MRSA by PCR NEGATIVE  NEGATIVE Final    Comment:        The GeneXpert MRSA Assay (FDA approved for NASAL specimens only), is one component of a comprehensive MRSA colonization surveillance program. It is not intended to diagnose MRSA infection nor to guide or monitor treatment for MRSA infections.     Medications:  Scheduled:  . antiseptic oral rinse  7 mL Mouth Rinse QID  . chlorhexidine gluconate  15 mL Mouth Rinse BID  . hydrocortisone sod succinate (SOLU-CORTEF) inj  50 mg Intravenous Q6H  . insulin glargine  40 Units Subcutaneous Daily  . micafungin [facility]) IV  100 mg Intravenous Daily  . pantoprazole (PROTONIX) IV  40 mg Intravenous Q24H  . piperacillin-tazobactam (ZOSYN)  IV  3.375 g Intravenous 3 times per day  . sodium bicarbonate      .  sodium bicarbonate      . vancomycin  1,000 mg Intravenous Q24H   Infusions:  . sodium chloride    . insulin (NOVOLIN-R) infusion Stopped (05/14/15 1019)  . norepinephrine (LEVOPHED) Adult infusion 5 mcg/min (05/14/15 0950)  . pureflow    .  sodium bicarbonate 150 mEq in sterile water 1000 mL infusion    . vasopressin (PITRESSIN) infusion - *FOR SHOCK* 0.03 Units/min (05/14/15 0952)   PRN: docusate sodium, heparin, iohexol  Assessment: Pharmacy consulted to adjust medications for CRRT in this 49 y/o M with respiratory failure, DKA, and septic shock.   Plan:  Vancomycin adjusted to CRRT dosing. No further medications need adjustment at present. Will continue to follow and adjust as necessary.   Luisa Hart D 05/14/2015,11:14 AM

## 2015-05-14 NOTE — Progress Notes (Signed)
Per Dr. Belia Heman, give a total of , 5 sodium bicarb injections.  Also per Dr. Belia Heman, no ceiling on the levophed.

## 2015-05-14 NOTE — Progress Notes (Signed)
Patient with high access pressures via CRRT.  Filter beginning to clot.  Manual rinse back completed.  New filter to be primed.

## 2015-05-14 NOTE — Consult Note (Signed)
Reason for Consult: encephalopathy Referring Physician: Dr. Dyanne Carrel is an 49 y.o. male.  HPI: seen at request of Dr. Leslye Peer for encephalopathy;  67 yo RHD M presents to [facility] from home due to unresponsiveness.  Pt was apparently not feeling good over the past few days and when his girlfriend came home yesterday he was not doing anything so EMS was called.  Pt was immediately intubated in ER due to poor responsiveness.  Here there are reports of some shaking episode.  Past Medical History  Diagnosis Date  . SOB (shortness of breath)   . Tachycardia   . Chest pain   . Hypertension   . Diabetes     No past surgical history on file.  Family History  Problem Relation Age of Onset  . Coronary artery disease Other   . Hypertension Other     Social History:  reports that he drinks about 3.0 oz of alcohol per week. He reports that he does not use illicit drugs. His tobacco history is not on file.  Allergies: No Known Allergies  Medications: personally reviewed by me  Results for orders placed or performed during the hospital encounter of 05/24/2015 (from the past 48 hour(s))  CBC with Differential     Status: Abnormal   Collection Time: 05/21/2015  3:04 PM  Result Value Ref Range   WBC 28.4 (H) 3.8 - 10.6 K/uL   RBC 4.62 4.40 - 5.90 MIL/uL   Hemoglobin 13.8 13.0 - 18.0 g/dL    Comment: RESULT REPEATED AND VERIFIED   HCT 48.8 40.0 - 52.0 %   MCV 105.7 (H) 80.0 - 100.0 fL   MCH 29.8 26.0 - 34.0 pg   MCHC 28.2 (L) 32.0 - 36.0 g/dL   RDW 15.5 (H) 11.5 - 14.5 %   Platelets 160 150 - 440 K/uL   Neutrophils Relative % 61 %   Lymphocytes Relative 22 %   Monocytes Relative 6 %   Eosinophils Relative 0 %   Basophils Relative 1 %   Band Neutrophils 9 %   Metamyelocytes Relative 1 %   Myelocytes 0 %   Promyelocytes Absolute 0 %   Blasts 0 %   nRBC 0 0 /100 WBC   Other 0 %   Neutro Abs 20.2 (H) 1.4 - 6.5 K/uL   Lymphs Abs 6.2 (H) 1.0 - 3.6 K/uL   Monocytes Absolute 1.7  (H) 0.2 - 1.0 K/uL   Eosinophils Absolute 0.0 0 - 0.7 K/uL   Basophils Absolute 0.3 (H) 0 - 0.1 K/uL   RBC Morphology OVAL MACROCYTES   Comprehensive metabolic panel     Status: Abnormal   Collection Time: 05/05/2015  3:04 PM  Result Value Ref Range   Sodium 141 135 - 145 mmol/L   Potassium 5.2 (H) 3.5 - 5.1 mmol/L   Chloride 99 (L) 101 - 111 mmol/L   CO2 6 (L) 22 - 32 mmol/L   Glucose, Bld 981 (HH) 65 - 99 mg/dL    Comment: CRITICAL RESULT CALLED TO, READ BACK BY AND VERIFIED WITH  DONALD SWEENEY AT 1628 05/01/2015 SDR    BUN 31 (H) 6 - 20 mg/dL   Creatinine, Ser 3.78 (H) 0.61 - 1.24 mg/dL   Calcium 9.6 8.9 - 10.3 mg/dL   Total Protein 8.5 (H) 6.5 - 8.1 g/dL   Albumin 4.6 3.5 - 5.0 g/dL   AST 52 (H) 15 - 41 U/L   ALT 22 17 - 63 U/L  Alkaline Phosphatase 149 (H) 38 - 126 U/L   Total Bilirubin 1.3 (H) 0.3 - 1.2 mg/dL   GFR calc non Af Amer 17 (L) >60 mL/min   GFR calc Af Amer 20 (L) >60 mL/min    Comment: (NOTE) The eGFR has been calculated using the CKD EPI equation. This calculation has not been validated in all clinical situations. eGFR's persistently <60 mL/min signify possible Chronic Kidney Disease.    Anion gap 36 (H) 5 - 15  Lactic acid, plasma     Status: Abnormal   Collection Time: 05/05/2015  3:04 PM  Result Value Ref Range   Lactic Acid, Venous 14.9 (HH) 0.5 - 2.0 mmol/L    Comment: RESULT CONFIRMED BY MANUAL DILUTION CRITICAL RESULT CALLED TO, READ BACK BY AND VERIFIED WITH  DONALD SWEENEY AT 1628 05/05/2015 SDR   Protime-INR     Status: Abnormal   Collection Time: 05/23/2015  3:04 PM  Result Value Ref Range   Prothrombin Time 19.0 (H) 11.4 - 15.0 seconds   INR 1.57   Troponin I     Status: None   Collection Time: 05/17/2015  3:04 PM  Result Value Ref Range   Troponin I <0.03 <0.031 ng/mL    Comment:        NO INDICATION OF MYOCARDIAL INJURY.   Urinalysis complete, with microscopic     Status: Abnormal   Collection Time: 04/30/2015  3:04 PM  Result Value Ref Range    Color, Urine STRAW (A) YELLOW   APPearance CLEAR (A) CLEAR   Glucose, UA >500 (A) NEGATIVE mg/dL   Bilirubin Urine NEGATIVE NEGATIVE   Ketones, ur 2+ (A) NEGATIVE mg/dL   Specific Gravity, Urine 1.018 1.005 - 1.030   Hgb urine dipstick 2+ (A) NEGATIVE   pH 5.0 5.0 - 8.0   Protein, ur 100 (A) NEGATIVE mg/dL   Nitrite NEGATIVE NEGATIVE   Leukocytes, UA NEGATIVE NEGATIVE   RBC / HPF 0-5 0 - 5 RBC/hpf   WBC, UA 0-5 0 - 5 WBC/hpf   Bacteria, UA RARE (A) NONE SEEN   Squamous Epithelial / LPF 0-5 (A) NONE SEEN   Mucous PRESENT   Ethanol     Status: None   Collection Time: 05/23/2015  3:04 PM  Result Value Ref Range   Alcohol, Ethyl (B) <5 <5 mg/dL    Comment:        LOWEST DETECTABLE LIMIT FOR SERUM ALCOHOL IS 5 mg/dL FOR MEDICAL PURPOSES ONLY   Glucose, capillary     Status: Abnormal   Collection Time: 05/23/2015  3:13 PM  Result Value Ref Range   Glucose-Capillary >600 (HH) 65 - 99 mg/dL   Comment 1 Notify RN    Comment 2 Repeat Test    Comment 3 Call MD NNP PA CNM   Glucose, capillary     Status: Abnormal   Collection Time: 05/03/2015  3:15 PM  Result Value Ref Range   Glucose-Capillary >600 (HH) 65 - 99 mg/dL   Comment 1 Notify RN    Comment 2 Repeat Test   Blood gas, venous     Status: Abnormal   Collection Time: 05/16/2015  3:32 PM  Result Value Ref Range   FIO2 100.00    Delivery systems VENTILATOR    Mode ASSIST CONTROL    VT 550 mL   LHR 16 resp/min   Peep/cpap 5.0 cm H20   pH, Ven 6.47 (LL) 7.320 - 7.430    Comment: CRITICAL RESULT CALLED TO, READ BACK  BY AND VERIFIED WITH: CRITICAL VALUE  DR Thomasene Lot [phone]    Patient temperature 37.0    Collection site VEIN    Sample type VENOUS   Urine culture     Status: None (Preliminary result)   Collection Time: 05/01/2015  3:52 PM  Result Value Ref Range   Specimen Description URINE, RANDOM    Special Requests NONE    Culture NO GROWTH    Report Status PENDING   Urine Drug Screen, Qualitative ([place] only)      Status: None   Collection Time: 05/16/2015  3:54 PM  Result Value Ref Range   Tricyclic, Ur Screen NONE DETECTED NONE DETECTED   Amphetamines, Ur Screen NONE DETECTED NONE DETECTED   MDMA (Ecstasy)Ur Screen NONE DETECTED NONE DETECTED   Cocaine Metabolite,Ur [place] NONE DETECTED NONE DETECTED   Opiate, Ur Screen NONE DETECTED NONE DETECTED   Phencyclidine (PCP) Ur S NONE DETECTED NONE DETECTED   Cannabinoid 50 Ng, Ur [place] NONE DETECTED NONE DETECTED   Barbiturates, Ur Screen NONE DETECTED NONE DETECTED   Benzodiazepine, Ur Scrn NONE DETECTED NONE DETECTED   Methadone Scn, Ur NONE DETECTED NONE DETECTED    Comment: (NOTE) 127  Tricyclics, urine               Cutoff 1000 ng/mL 200  Amphetamines, urine             Cutoff 1000 ng/mL 300  MDMA (Ecstasy), urine           Cutoff 500 ng/mL 400  Cocaine Metabolite, urine       Cutoff 300 ng/mL 500  Opiate, urine                   Cutoff 300 ng/mL 600  Phencyclidine (PCP), urine      Cutoff 25 ng/mL 700  Cannabinoid, urine              Cutoff 50 ng/mL 800  Barbiturates, urine             Cutoff 200 ng/mL 900  Benzodiazepine, urine           Cutoff 200 ng/mL 1000 Methadone, urine                Cutoff 300 ng/mL 1100 1200 The urine drug screen provides only a preliminary, unconfirmed 1300 analytical test result and should not be used for non-medical 1400 purposes. Clinical consideration and professional judgment should 1500 be applied to any positive drug screen result due to possible 1600 interfering substances. A more specific alternate chemical method 1700 must be used in order to obtain a confirmed analytical result.  1800 Gas chromato graphy / mass spectrometry (GC/MS) is the preferred 1900 confirmatory method.   Ammonia     Status: Abnormal   Collection Time: 05/08/2015  4:40 PM  Result Value Ref Range   Ammonia 283 (H) 9 - 35 umol/L  Lactic acid, plasma     Status: Abnormal   Collection Time: 05/21/2015  4:40 PM  Result Value Ref Range    Lactic Acid, Venous 15.2 (HH) 0.5 - 2.0 mmol/L    Comment: CRITICAL RESULT CALLED TO, READ BACK BY AND VERIFIED WITH  KIM SMYTHE AT 5170 05/07/2015 SDR   Culture, blood (routine x 2)     Status: None (Preliminary result)   Collection Time: 05/09/2015  6:08 PM  Result Value Ref Range   Specimen Description BLOOD RIGHT ASSIST CONTROL    Special Requests BOTTLES DRAWN AEROBIC  AND ANAEROBIC  [place]    Culture NO GROWTH < 24 HOURS    Report Status PENDING   Glucose, capillary     Status: Abnormal   Collection Time: 05/29/2015  6:09 PM  Result Value Ref Range   Glucose-Capillary 580 (HH) 65 - 99 mg/dL  Culture, blood (routine x 2)     Status: None (Preliminary result)   Collection Time: 05/23/2015  6:34 PM  Result Value Ref Range   Specimen Description BLOOD RIGHT ASSIST CONTROL    Special Requests BOTTLES DRAWN AEROBIC AND ANAEROBIC  [place]    Culture NO GROWTH < 24 HOURS    Report Status PENDING   Glucose, capillary     Status: Abnormal   Collection Time: 05/16/2015  7:20 PM  Result Value Ref Range   Glucose-Capillary >600 (HH) 65 - 99 mg/dL  Lactic acid, plasma     Status: Abnormal   Collection Time: 05/22/2015  7:25 PM  Result Value Ref Range   Lactic Acid, Venous 17.4 (HH) 0.5 - 2.0 mmol/L    Comment: CRITICAL RESULT CALLED TO, READ BACK BY AND VERIFIED WITH  RENEE BABB AT 2022 05/06/2015 SDR   Troponin I     Status: None   Collection Time: 05/26/2015  7:25 PM  Result Value Ref Range   Troponin I <0.03 <0.031 ng/mL    Comment:        NO INDICATION OF MYOCARDIAL INJURY.   Protime-INR     Status: Abnormal   Collection Time: 05/14/2015  7:25 PM  Result Value Ref Range   Prothrombin Time 22.5 (H) 11.4 - 15.0 seconds   INR 1.96   Procalcitonin     Status: None   Collection Time: 05/04/2015  7:25 PM  Result Value Ref Range   Procalcitonin 0.77 ng/mL  APTT     Status: Abnormal   Collection Time: 05/12/2015  7:25 PM  Result Value Ref Range   aPTT 62 (H) 24 - 36 seconds    Comment:        IF BASELINE  aPTT IS ELEVATED, SUGGEST PATIENT RISK ASSESSMENT BE USED TO DETERMINE APPROPRIATE ANTICOAGULANT THERAPY.   Type and screen     Status: None   Collection Time: 05/18/2015  7:25 PM  Result Value Ref Range   ABO/RH(D) O POS    Antibody Screen NEG    Sample Expiration 05/16/2015   ABO/Rh     Status: None   Collection Time: 05/01/2015  7:26 PM  Result Value Ref Range   ABO/RH(D) O POS   Glucose, capillary     Status: Abnormal   Collection Time: 05/09/2015  8:08 PM  Result Value Ref Range   Glucose-Capillary 517 (H) 65 - 99 mg/dL  Blood gas, arterial     Status: Abnormal   Collection Time: 05/18/2015  8:15 PM  Result Value Ref Range   FIO2 0.60    Mode PRESSURE REGULATED VOLUME CONTROL    VT 550 mL   LHR 16 resp/min   Peep/cpap 8.0 cm H20   pH, Arterial 6.59 (LL) 7.350 - 7.450    Comment: CRITICAL RESULT CALLED TO, READ BACK BY AND VERIFIED WITH:  MARK CHISHOLM [id number] ELINK RN    pCO2 arterial 38 32.0 - 48.0 mmHg   pO2, Arterial 266 (H) 83.0 - 108.0 mmHg   Patient temperature 37.0    Collection site A-LINE    Sample type ARTERIAL DRAW    Allens test (pass/fail) PASS PASS   Mechanical Rate 16  Basic metabolic panel     Status: Abnormal   Collection Time: 05/04/2015  8:42 PM  Result Value Ref Range   Sodium 145 135 - 145 mmol/L   Potassium 5.5 (H) 3.5 - 5.1 mmol/L   Chloride 116 (H) 101 - 111 mmol/L   CO2 6 (L) 22 - 32 mmol/L   Glucose, Bld 671 (HH) 65 - 99 mg/dL    Comment: CRITICAL RESULT CALLED TO, READ BACK BY AND VERIFIED WITH erica taylor at 2341 05/05/2015 lkh    BUN 29 (H) 6 - 20 mg/dL   Creatinine, Ser 3.86 (H) 0.61 - 1.24 mg/dL   Calcium 6.6 (L) 8.9 - 10.3 mg/dL   GFR calc non Af Amer 17 (L) >60 mL/min   GFR calc Af Amer 19 (L) >60 mL/min    Comment: (NOTE) The eGFR has been calculated using the CKD EPI equation. This calculation has not been validated in all clinical situations. eGFR's persistently <60 mL/min signify possible Chronic Kidney Disease.    Anion  gap 23 (H) 5 - 15    Comment: REPEATED  Glucose, capillary     Status: Abnormal   Collection Time: 05/29/2015  8:59 PM  Result Value Ref Range   Glucose-Capillary 558 (HH) 65 - 99 mg/dL   Comment 1 Notify RN   Blood gas, arterial     Status: Abnormal   Collection Time: 05/29/2015  9:40 PM  Result Value Ref Range   FIO2 0.60    Mode PRESSURE REGULATED VOLUME CONTROL    VT 550 mL   pH, Arterial 6.67 (LL) 7.350 - 7.450    Comment: CRITICAL RESULT CALLED TO, READ BACK BY AND VERIFIED WITH: DR Wyvonna Plum [phone]    pCO2 arterial 24 (L) 32.0 - 48.0 mmHg   pO2, Arterial 240 (H) 83.0 - 108.0 mmHg   Patient temperature 37.0    Collection site A-LINE    Sample type ARTERIAL DRAW    Allens test (pass/fail) PASS PASS   Mechanical Rate 35   Lactic acid, plasma     Status: Abnormal   Collection Time: 05/08/2015  9:50 PM  Result Value Ref Range   Lactic Acid, Venous 14.2 (HH) 0.5 - 2.0 mmol/L    Comment: CRITICAL RESULT CALLED TO, READ BACK BY AND VERIFIED WITH: RENEE BABB 05/25/2015 2305 LKH VERIFIED BY MANUAL DILUTION   Osmolality, urine     Status: Abnormal   Collection Time: 05/27/2015 10:00 PM  Result Value Ref Range   Osmolality, Ur 367 (L) 390 - 1090 mOsm/kg  Glucose, capillary     Status: Abnormal   Collection Time: 05/14/2015 10:01 PM  Result Value Ref Range   Glucose-Capillary 588 (HH) 65 - 99 mg/dL   Comment 1 Notify RN   Glucose, capillary     Status: Abnormal   Collection Time: 05/08/2015 11:13 PM  Result Value Ref Range   Glucose-Capillary 481 (H) 65 - 99 mg/dL  Urinalysis complete, with microscopic ([facility] only)     Status: Abnormal   Collection Time: 05/21/2015 11:20 PM  Result Value Ref Range   Color, Urine AMBER (A) YELLOW   APPearance TURBID (A) CLEAR   Glucose, UA >500 (A) NEGATIVE mg/dL   Bilirubin Urine NEGATIVE NEGATIVE   Ketones, ur TRACE (A) NEGATIVE mg/dL   Specific Gravity, Urine 1.014 1.005 - 1.030   Hgb urine dipstick 3+ (A) NEGATIVE   pH 5.0 5.0 - 8.0    Protein, ur >500 (A) NEGATIVE mg/dL   Nitrite NEGATIVE NEGATIVE  Leukocytes, UA NEGATIVE NEGATIVE   RBC / HPF 6-30 0 - 5 RBC/hpf   WBC, UA 0-5 0 - 5 WBC/hpf   Bacteria, UA RARE (A) NONE SEEN   Squamous Epithelial / LPF 0-5 (A) NONE SEEN   Mucous PRESENT    Amorphous Crystal PRESENT   Glucose, capillary     Status: Abnormal   Collection Time: 05/14/15 12:12 AM  Result Value Ref Range   Glucose-Capillary 573 (HH) 65 - 99 mg/dL   Comment 1 Notify RN   Blood gas, arterial     Status: Abnormal   Collection Time: 05/14/15  1:00 AM  Result Value Ref Range   FIO2 0.40    Delivery systems VENTILATOR    Mode PRESSURE REGULATED VOLUME CONTROL    VT 600 mL   LHR 35 resp/min   pH, Arterial 6.85 (LL) 7.350 - 7.450    Comment: CRITICAL RESULT CALLED TO, READ BACK BY AND VERIFIED WITH: CRITICAL RESULTS CALLED TO DR.KASA AT 0120 ON 05/14/15    pCO2 arterial 22 (L) 32.0 - 48.0 mmHg   pO2, Arterial 152 (H) 83.0 - 108.0 mmHg   Patient temperature 37.0    Collection site A-LINE    Sample type ARTERIAL DRAW    Mechanical Rate 35   Glucose, capillary     Status: Abnormal   Collection Time: 05/14/15  1:13 AM  Result Value Ref Range   Glucose-Capillary 571 (HH) 65 - 99 mg/dL   Comment 1 Notify RN   Basic metabolic panel (stat then every 4 hours)     Status: Abnormal   Collection Time: 05/14/15  2:04 AM  Result Value Ref Range   Sodium 149 (H) 135 - 145 mmol/L   Potassium 4.9 3.5 - 5.1 mmol/L   Chloride 113 (H) 101 - 111 mmol/L   CO2 12 (L) 22 - 32 mmol/L   Glucose, Bld 707 (HH) 65 - 99 mg/dL    Comment: CRITICAL RESULT CALLED TO, READ BACK BY AND VERIFIED WITH RENEE BABB 05/14/2015 0230 LKH   BUN 29 (H) 6 - 20 mg/dL   Creatinine, Ser 4.11 (H) 0.61 - 1.24 mg/dL   Calcium 6.4 (LL) 8.9 - 10.3 mg/dL   GFR calc non Af Amer 16 (L) >60 mL/min   GFR calc Af Amer 18 (L) >60 mL/min    Comment: (NOTE) The eGFR has been calculated using the CKD EPI equation. This calculation has not been validated in  all clinical situations. eGFR's persistently <60 mL/min signify possible Chronic Kidney Disease.    Anion gap 24 (H) 5 - 15    Comment: REPEATED  Troponin I     Status: Abnormal   Collection Time: 05/14/15  2:04 AM  Result Value Ref Range   Troponin I 0.04 (H) <0.031 ng/mL    Comment: READ BACK AND VERIFIED RENEE BABB 05/14/2015 0237 LKH        PERSISTENTLY INCREASED TROPONIN VALUES IN THE RANGE OF 0.04-0.49 ng/mL CAN BE SEEN IN:       -UNSTABLE ANGINA       -CONGESTIVE HEART FAILURE       -MYOCARDITIS       -CHEST TRAUMA       -ARRYHTHMIAS       -LATE PRESENTING MYOCARDIAL INFARCTION       -COPD   CLINICAL FOLLOW-UP RECOMMENDED.   Glucose, capillary     Status: Abnormal   Collection Time: 05/14/15  2:19 AM  Result Value Ref Range   Glucose-Capillary 587 (  HH) 53 - 99 mg/dL   Comment 1 Notify RN   MRSA PCR Screening     Status: None   Collection Time: 05/14/15  2:48 AM  Result Value Ref Range   MRSA by PCR NEGATIVE NEGATIVE    Comment:        The GeneXpert MRSA Assay (FDA approved for NASAL specimens only), is one component of a comprehensive MRSA colonization surveillance program. It is not intended to diagnose MRSA infection nor to guide or monitor treatment for MRSA infections.   Glucose, capillary     Status: Abnormal   Collection Time: 05/14/15  3:32 AM  Result Value Ref Range   Glucose-Capillary 586 (HH) 65 - 99 mg/dL   Comment 1 Document in Chart   Glucose, capillary     Status: Abnormal   Collection Time: 05/14/15  4:04 AM  Result Value Ref Range   Glucose-Capillary >600 (HH) 65 - 99 mg/dL  Basic metabolic panel (stat then every 4 hours)     Status: Abnormal   Collection Time: 05/14/15  4:43 AM  Result Value Ref Range   Sodium 148 (H) 135 - 145 mmol/L   Potassium 3.7 3.5 - 5.1 mmol/L   Chloride 112 (H) 101 - 111 mmol/L   CO2 9 (L) 22 - 32 mmol/L   Glucose, Bld 796 (HH) 65 - 99 mg/dL    Comment: CRITICAL RESULT CALLED TO, READ BACK BY AND VERIFIED  WITH RACHEL VERDI AT 4132 05/14/15 DAS    BUN 33 (H) 6 - 20 mg/dL   Creatinine, Ser 4.33 (H) 0.61 - 1.24 mg/dL   Calcium 6.4 (LL) 8.9 - 10.3 mg/dL    Comment: CRITICAL RESULT CALLED TO, READ BACK BY AND VERIFIED WITH RACHEL VERDI AT 4401 05/14/15 DAS    GFR calc non Af Amer 15 (L) >60 mL/min   GFR calc Af Amer 17 (L) >60 mL/min    Comment: (NOTE) The eGFR has been calculated using the CKD EPI equation. This calculation has not been validated in all clinical situations. eGFR's persistently <60 mL/min signify possible Chronic Kidney Disease.    Anion gap 27 (H) 5 - 15  Troponin I     Status: Abnormal   Collection Time: 05/14/15  4:43 AM  Result Value Ref Range   Troponin I 0.07 (H) <0.031 ng/mL    Comment: READ BACK AND VERIFIED WITH RACHEL VERDI AT 0272 05/14/15 DAS        PERSISTENTLY INCREASED TROPONIN VALUES IN THE RANGE OF 0.04-0.49 ng/mL CAN BE SEEN IN:       -UNSTABLE ANGINA       -CONGESTIVE HEART FAILURE       -MYOCARDITIS       -CHEST TRAUMA       -ARRYHTHMIAS       -LATE PRESENTING MYOCARDIAL INFARCTION       -COPD   CLINICAL FOLLOW-UP RECOMMENDED.   Glucose, capillary     Status: Abnormal   Collection Time: 05/14/15  5:29 AM  Result Value Ref Range   Glucose-Capillary >600 (HH) 65 - 99 mg/dL  Glucose, capillary     Status: Abnormal   Collection Time: 05/14/15  6:25 AM  Result Value Ref Range   Glucose-Capillary >600 (HH) 65 - 99 mg/dL  Glucose, capillary     Status: Abnormal   Collection Time: 05/14/15  6:54 AM  Result Value Ref Range   Glucose-Capillary >600 (HH) 65 - 99 mg/dL  Glucose, capillary     Status: Abnormal  Collection Time: 05/14/15  8:06 AM  Result Value Ref Range   Glucose-Capillary >600 (HH) 65 - 99 mg/dL  Potassium     Status: Abnormal   Collection Time: 05/14/15  8:31 AM  Result Value Ref Range   Potassium 3.1 (L) 3.5 - 5.1 mmol/L  Blood gas, arterial     Status: Abnormal (Preliminary result)   Collection Time: 05/14/15  8:48 AM   Result Value Ref Range   FIO2 40.00    O2 Content PENDING L/min   Delivery systems VENTILATOR    Mode PRESSURE REGULATED VOLUME CONTROL    VT 500 mL   LHR 35 resp/min   Peep/cpap 5.0 cm H20   PIP PENDING cm H2O   Pressure support PENDING cm H20   Pressure control PENDING cm H20   pH, Arterial 7.31 (L) 7.350 - 7.450   pCO2 arterial 19 (LL) 32.0 - 48.0 mmHg    Comment: CRITICAL VALUE NOTED.  VALUE IS CONSISTENT WITH PREVIOUSLY REPORTED AND CALLED VALUE.   pO2, Arterial 165 (H) 83.0 - 108.0 mmHg   Collection site A-LINE    Sample type ARTERIAL DRAW   Glucose, capillary     Status: Abnormal   Collection Time: 05/14/15  9:00 AM  Result Value Ref Range   Glucose-Capillary 599 (HH) 65 - 99 mg/dL   Comment 1 Notify RN   Glucose, capillary     Status: Abnormal   Collection Time: 05/14/15  9:50 AM  Result Value Ref Range   Glucose-Capillary >600 (HH) 65 - 99 mg/dL  Lactic acid, plasma     Status: Abnormal   Collection Time: 05/14/15 10:20 AM  Result Value Ref Range   Lactic Acid, Venous 7.8 (HH) 0.5 - 2.0 mmol/L    Comment: CRITICAL RESULT CALLED TO, READ BACK BY AND VERIFIED WITH BRITTANY KILLINGSWORTH AT 1123 05/14/15 DAS   Troponin I     Status: Abnormal   Collection Time: 05/14/15 10:20 AM  Result Value Ref Range   Troponin I 0.23 (H) <0.031 ng/mL    Comment: RESULTS PREVIOUSLY CALLED AT 0837 05/14/15 BY DAS        PERSISTENTLY INCREASED TROPONIN VALUES IN THE RANGE OF 0.04-0.49 ng/mL CAN BE SEEN IN:       -UNSTABLE ANGINA       -CONGESTIVE HEART FAILURE       -MYOCARDITIS       -CHEST TRAUMA       -ARRYHTHMIAS       -LATE PRESENTING MYOCARDIAL INFARCTION       -COPD   CLINICAL FOLLOW-UP RECOMMENDED.   Renal function     Status: Abnormal   Collection Time: 05/14/15 10:20 AM  Result Value Ref Range   Sodium 143 135 - 145 mmol/L   Potassium 2.7 (LL) 3.5 - 5.1 mmol/L    Comment: CRITICAL RESULT CALLED TO, READ BACK BY AND VERIFIED WITH BRITTANY KILLINGSWORTH AT  1116 05/14/15 DAS    Chloride 111 101 - 111 mmol/L   CO2 14 (L) 22 - 32 mmol/L   Glucose, Bld 898 (HH) 65 - 99 mg/dL    Comment: CRITICAL RESULT CALLED TO, READ BACK BY AND VERIFIED WITH BRITTANY KILLINGSWORTH AT 1116 05/14/15 DAS    BUN 38 (H) 6 - 20 mg/dL   Creatinine, Ser 5.19 (H) 0.61 - 1.24 mg/dL   Calcium 6.6 (L) 8.9 - 10.3 mg/dL   Phosphorus <1.0 (LL) 2.5 - 4.6 mg/dL    Comment: CRITICAL RESULT CALLED TO, READ BACK BY AND  VERIFIED WITH BRITTANY KILLINGSWORTH AT 1116 05/14/15 DAS    Albumin 2.4 (L) 3.5 - 5.0 g/dL   GFR calc non Af Amer 12 (L) >60 mL/min   GFR calc Af Amer 14 (L) >60 mL/min    Comment: (NOTE) The eGFR has been calculated using the CKD EPI equation. This calculation has not been validated in all clinical situations. eGFR's persistently <60 mL/min signify possible Chronic Kidney Disease.    Anion gap 18 (H) 5 - 15  Glucose, capillary     Status: Abnormal   Collection Time: 05/14/15 11:25 AM  Result Value Ref Range   Glucose-Capillary >600 (HH) 65 - 99 mg/dL  Glucose, capillary     Status: Abnormal   Collection Time: 05/14/15 12:21 PM  Result Value Ref Range   Glucose-Capillary >600 (HH) 65 - 99 mg/dL   Comment 1 Notify RN     Dg Abd 1 View  05/14/2015   CLINICAL DATA:  OG tube placement.  EXAM: ABDOMEN - 1 VIEW  COMPARISON:  None.  FINDINGS: Tip of the enteric tube is in the distal stomach. The side port is below the diaphragm. No dilated bowel loops to suggest obstruction. There is mild motion artifact.  IMPRESSION: Tip and side port of the enteric tube below the diaphragm in the stomach.   Electronically Signed   By: Jeb Levering M.D.   On: 05/14/2015 06:32   Ct Head Wo Contrast  05/19/2015   CLINICAL DATA:  Unresponsive  EXAM: CT HEAD WITHOUT CONTRAST  TECHNIQUE: Contiguous axial images were obtained from the base of the skull through the vertex without intravenous contrast.  COMPARISON:  None.  FINDINGS: There is no midline shift, hydrocephalus, or  mass. No acute hemorrhage or acute transcortical infarct is identified. There is left maxillary sinus retention cyst. Mild mucoperiosteal thickening of the right ethmoid sinuses identified. The bony calvarium is intact.  IMPRESSION: No focal acute intracranial abnormality identified.   Electronically Signed   By: Abelardo Diesel M.D.   On: 05/23/2015 16:05   Dg Chest Port 1 View  05/01/2015   CLINICAL DATA:  Acute respiratory failure with hypoxia. Central line placement.  EXAM: PORTABLE CHEST - 1 VIEW  COMPARISON:  05/20/2015  FINDINGS: New left jugular central venous catheter is seen with tip overlying the superior cavoatrial junction. Endotracheal tube remains in appropriate position. No pneumothorax visualized. Low lung volumes are seen however both lungs remain clear. Heart size is within normal limits.  IMPRESSION: New left jugular central venous catheter in appropriate position. No pneumothorax visualized.  Low lung volumes.  No active lung disease.   Electronically Signed   By: Earle Gell M.D.   On: 05/21/2015 19:56   Dg Chest Port 1 View  05/26/2015   CLINICAL DATA:  Altered mental status.  Intubation.  EXAM: PORTABLE CHEST - 1 VIEW  COMPARISON:  Chest x-ray dated 02/22/2010  FINDINGS: Endotracheal tube is in good position. Heart size and pulmonary vascularity are normal. Lungs are clear. No acute osseous abnormality.  IMPRESSION: Endotracheal tube in good position.  No visible acute abnormalities.   Electronically Signed   By: Lorriane Shire M.D.   On: 05/01/2015 15:48    Review of Systems  Unable to perform ROS: critical illness   Blood pressure 108/60, pulse 102, temperature 99.1 F (37.3 C), temperature source Other (Comment), resp. rate 25, height 6' (1.829 m), weight 99.791 kg (220 lb), SpO2 100 %. Physical Exam  Nursing note and vitals reviewed. Constitutional: He appears  well-developed and well-nourished. He appears distressed.  HENT:  Head: Normocephalic and atraumatic.  Nose: Nose  normal.  Mouth/Throat: Oropharynx is clear and moist.  Eyes: Conjunctivae are normal. Pupils are equal, round, and reactive to light. No scleral icterus.  Neck: Normal range of motion. Neck supple.  Cardiovascular: Normal rate, regular rhythm, normal heart sounds and intact distal pulses.   No murmur heard. Respiratory: Effort normal and breath sounds normal. No respiratory distress.  GI: Soft. Bowel sounds are normal. He exhibits no distension.  Musculoskeletal: Normal range of motion. He exhibits edema.  Neurological:  Intubated, not sedated, eyes open but do not track or follow, GCS 5T Pupils 67m and reactive B, weak R corneal, good L corneal, forces L eye deviation, weak cough Extends B equally to pain, nl tone 1+/4 B, mute plantars B Grimaces to pain B  Skin: He is diaphoretic.   CT of head personally reviewed by me and shows mild old white matter changes  Assessment/Plan: 1.  Possible seizure-  The forced L gaze is concerning for seizure plus unresponsiveness;  This could be provoked purely by hyperglycemia or electrolyte abnormalities 2.  Encephalopathy-  Severe and could be a combination of infectious, ictal and toxic/metabolic derangements -  EEG in am -  Repeat CT of head in am -  Load Vimpat 2069mIV now then 100109m12h -  Keep Mg > 2, Ca > 8 and Na >130 -  Agree with dialysis -  Continue antibiotics -  Will follow closely  SMITH, MATTHEW 05/14/2015, 2:09 PM

## 2015-05-14 NOTE — Consult Note (Addendum)
Endocrine Initial Consult Note Date of Consult: 05/14/2015  Consulting Service: [facility] Endocrinology  Requesting MD:  Bettey Costa  SUBJECTIVE: Reason for Consultation: DKA  The history is limited, pt intubated. Some information provided by sister at bedside.  History of Present Illness: Luis Perry is a 49 y.o. male with PMH HTN and type 2 Diabetes diagnosed in July 2016 admitted with severe hyperglycemia, DKA, acute renal failure, and acute respiratory failure. Reportedly he was diagnosed with diabetes while hospitalized for joint replacement surgery July 2016. He followed up with a primary doctor in [place] later and was started on Metformin therapy. It is unclear whether he has been taking the medication and his other medical history is not certain at this point. The only known outpatient medication per the pharmacy was oxycodone.   According to the patient's sister, The patient was sick since about 5 days ago when he complained of having a "stomach bug." He was nauseated and had some vomiting. PO intake was poor. His fiancee came home from work to check on him during the day yesterday and found him still in bed. He was responsive but mentally altered. She called EMS. Upon their arrival, he was hypotensive, hypoxic, and severely hyperglycemic. En route to the hospital his mental status deteriorated. He was admitted to the ICU with shock, DKA, acute renal failure with severe metabolic acidosis, and acute respiratory failure requiring intubation. Endocrinology has been consulted for DKA and management of hyperglycemia. He is on IV insulin infusion with BG>600 at a rate of 50 units/hour.   DM history: Little is known at this point other than diagnosis was 02/2015 and metformin therapy was started. Last Hb A1c unknown. There is a strong family history of diabetes per pt's sister.   ROS could not be completed.   Patient Active Problem List   Diagnosis Date Noted  . Hyperglycemia  05/07/2015  . DKA (diabetic ketoacidoses) 04/30/2015  . Acute respiratory failure with hypoxia   . Dyspnea   . Arterial hypotension   . 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 HYPERTENSION, UNSPECIFIED 03/05/2010  . ATRIAL TACHYCARDIA 03/05/2010  . TACHYCARDIA 03/05/2010  . DYSPNEA 03/05/2010  . CHEST PAIN-UNSPECIFIED 03/05/2010     Past Medical History  Diagnosis Date  . SOB (shortness of breath)   . Tachycardia   . Chest pain   . Hypertension   . Diabetes    No past surgical history on file. Family History  Problem Relation Age of Onset  . Coronary artery disease Other   . Hypertension Other     Social History:  Social History  Substance Use Topics  . Smoking status: Unknown If Ever Smoked  . Smokeless tobacco: Not on file  . Alcohol Use: 3.0 oz/week    6 drink(s) per week     Comment: 6 pack a week     No Known Allergies   Medications:  Reportedly only Rx was oxycodone   Current facility-administered medications:  .  0.9 %  sodium chloride infusion, , Intravenous, Continuous, Laverle Hobby, MD .  antiseptic oral rinse solution (CORINZ), 7 mL, Mouth Rinse, QID, Laverle Hobby, MD, 7 mL at 05/14/15 1200 .  chlorhexidine gluconate (PERIDEX) 0.12 % solution 15 mL, 15 mL, Mouth Rinse, BID, Laverle Hobby, MD, 15 mL at 05/14/15 0945 .  docusate sodium (COLACE) capsule 100 mg, 100 mg, Oral, BID PRN, Laverle Hobby, MD .  heparin injection 1,000-6,000 Units, 1,000-6,000 Units, CRRT, PRN, Murlean Iba, MD .  hydrocortisone sodium succinate (SOLU-CORTEF)  100 MG injection 50 mg, 50 mg, Intravenous, Q12H, Wilhelmina Mcardle, MD .  insulin glargine (LANTUS) injection 40 Units, 40 Units, Subcutaneous, Daily, Irasema Chalk Percell Locus, MD, 40 Units at 05/14/15 1019 .  insulin regular (NOVOLIN R,HUMULIN R) 250 Units in sodium chloride 0.9 % 250 mL (1 Units/mL) infusion, , Intravenous, Continuous, Laverle Hobby, MD, Last Rate: 50 mL/hr at 05/14/15 1207, 50  Units/hr at 05/14/15 1207 .  iohexol (OMNIPAQUE) 240 MG/ML injection 50 mL, 50 mL, Oral, Once PRN, Medication Radiologist, MD .  micafungin (MYCAMINE) 100 mg in sodium chloride 0.9 % 100 mL IVPB, 100 mg, Intravenous, Daily, Raylene Miyamoto, MD, 100 mg at 05/14/15 1134 .  norepinephrine (LEVOPHED) 16 mg in dextrose 5 % 250 mL (0.064 mg/mL) infusion, 0-40 mcg/min, Intravenous, Titrated, Wilhelmina Mcardle, MD, Last Rate: 4.7 mL/hr at 05/14/15 0950, 5 mcg/min at 05/14/15 0950 .  pantoprazole (PROTONIX) injection 40 mg, 40 mg, Intravenous, Q24H, Laverle Hobby, MD, 40 mg at 05/16/2015 2054 .  piperacillin-tazobactam (ZOSYN) IVPB 3.375 g, 3.375 g, Intravenous, 3 times per day, Bettey Costa, MD, 3.375 g at 05/14/15 0358 .  pureflow IV solution for Dialysis, , CRRT, Continuous, Harmeet Singh, MD, Last Rate: 2,000 mL/hr at 05/14/15 1115 .  sodium bicarbonate 1 mEq/mL injection, , , ,  .  sodium bicarbonate 1 mEq/mL injection, , , ,  .  sodium bicarbonate 150 mEq in sterile water 1,000 mL infusion, , Intravenous, Continuous, Wilhelmina Mcardle, MD, Last Rate: 200 mL/hr at 05/14/15 1135 .  vancomycin (VANCOCIN) IVPB 1000 mg/200 mL premix, 1,000 mg, Intravenous, Q24H, Loletha Grayer, MD .  vasopressin (PITRESSIN) 40 Units in sodium chloride 0.9 % 250 mL (0.16 Units/mL) infusion, 0.03 Units/min, Intravenous, Continuous, Wilhelmina Mcardle, MD, Last Rate: 11.3 mL/hr at 05/14/15 0952, 0.03 Units/min at 05/14/15 0952  OBJECTIVE: Temp:  [89.9 F (32.2 C)-100.6 F (38.1 C)] 100.2 F (37.9 C) (09/15 0915) Pulse Rate:  [73-115] 102 (09/15 0915) Resp:  [16-37] 36 (09/15 0915) BP: (67-155)/(18-75) 115/65 mmHg (09/15 0915) SpO2:  [88 %-100 %] 100 % (09/15 0915) Arterial Line BP: (90-135)/(35-57) 134/57 mmHg (09/15 0600) FiO2 (%):  [40 %-100 %] 40 % (09/15 0915) Weight:  [99.791 kg (220 lb)] 99.791 kg (220 lb) (09/14 1522)  Temp (24hrs), Avg:95.3 F (35.2 C), Min:89.9 F (32.2 C), Max:100.6 F (38.1 C)  Weight:  99.791 kg (220 lb)  Physical Exam: Gen: intubated, sedated HEENT: ET tube in place Neck: no masses Pulm: ventilated breath sounds, clear CVS: rrr, tachycardic Abd: normoactive bowel sounds, soft, obese Extr: no peripheral edema GU: foley cath in place Skin: mottled appearing on the lower extremities but warm Neuro: sedated   Laboratory data:  BMP Latest Ref Rng 05/14/2015 05/14/2015 05/14/2015  Glucose 65 - 99 mg/dL 898(HH) - 796(HH)  BUN 6 - 20 mg/dL 38(H) - 33(H)  Creatinine 0.61 - 1.24 mg/dL 5.19(H) - 4.33(H)  Sodium 135 - 145 mmol/L 143 - 148(H)  Potassium 3.5 - 5.1 mmol/L 2.7(LL) 3.1(L) 3.7  Chloride 101 - 111 mmol/L 111 - 112(H)  CO2 22 - 32 mmol/L 14(L) - 9(L)  Calcium 8.9 - 10.3 mg/dL 6.6(L) - 6.4(LL)    CBC Latest Ref Rng 05/07/2015 03/19/2010  WBC 3.8 - 10.6 K/uL 28.4(H) 8.7  Hemoglobin 13.0 - 18.0 g/dL 13.8 15.2  Hematocrit 40.0 - 52.0 % 48.8 45.3  Platelets 150 - 440 K/uL 160 180   Hepatic Function Latest Ref Rng 05/14/2015 05/27/2015 03/15/2010  Total Protein 6.5 - 8.1 g/dL -  8.5(H) 8.1  Albumin 3.5 - 5.0 g/dL 2.4(L) 4.6 5.1  AST 15 - 41 U/L - 52(H) 75(H)  ALT 17 - 63 U/L - 22 79(H)  Alk Phosphatase 38 - 126 U/L - 149(H) 70  Total Bilirubin 0.3 - 1.2 mg/dL - 1.3(H) 0.7  Bilirubin, Direct 0.0-0.3 mg/dL - - 0.2   Blood glucose values reviewed in glucose accordion view  ASSESSMENT : 49 yo man with recently diagnosed DM type 2 admitted with severe hyperglycemia, DKA, acute renal failure, lactic acidosis, and acute respiratory failure. He is in critical condition experiencing glucose toxicity, intubated and sedated. His DKA was likely triggered by an acute viral illness but this is not completely clear. The lactic acidosis could be due to septic shock or metformin therapy or both.  PLAN: Aggressive fluid and electrolyte repletion, potassium especially  Administer 40 units of subQ Lantus once   Hold IV insulin infusion for 1 hour, then restart according to DKA protocol  (goal BG 140-180 mg/dL) Verify that insulin is infusing properly; may need to change IV line. Would not go beyond 50 u/hr infusion rate. Hourly glucometer checks  He needs hemodialysis urgently, Nephrology following with plans to start Would check BMP every 6 hours after CRRT initiated Agree with stress dose steroids. Recommend quick tapering as patient's condition stabilizes to reduce steroid-induced hyperglycemia. Will follow along closely Thank you for allowing me to participate in the patient's care  Atha Starks, MD [facility] Endocrinology

## 2015-05-15 ENCOUNTER — Inpatient Hospital Stay: Payer: Medicaid Other

## 2015-05-15 ENCOUNTER — Encounter: Payer: Self-pay | Admitting: Radiology

## 2015-05-15 DIAGNOSIS — J9601 Acute respiratory failure with hypoxia: Secondary | ICD-10-CM

## 2015-05-15 DIAGNOSIS — G9382 Brain death: Secondary | ICD-10-CM

## 2015-05-15 LAB — CBC
HCT: 37.5 % — ABNORMAL LOW (ref 40.0–52.0)
Hemoglobin: 12.9 g/dL — ABNORMAL LOW (ref 13.0–18.0)
MCH: 30.1 pg (ref 26.0–34.0)
MCHC: 34.3 g/dL (ref 32.0–36.0)
MCV: 87.6 fL (ref 80.0–100.0)
PLATELETS: 50 10*3/uL — AB (ref 150–440)
RBC: 4.28 MIL/uL — ABNORMAL LOW (ref 4.40–5.90)
RDW: 13.9 % (ref 11.5–14.5)
WBC: 14.1 10*3/uL — ABNORMAL HIGH (ref 3.8–10.6)

## 2015-05-15 LAB — BLOOD GAS, ARTERIAL
Acid-base deficit: 2.7 mmol/L — ABNORMAL HIGH (ref 0.0–2.0)
Bicarbonate: 20.4 mEq/L — ABNORMAL LOW (ref 21.0–28.0)
FIO2: 0.3
MECHANICAL RATE: 16
O2 Saturation: 99.4 %
PATIENT TEMPERATURE: 36.4
PEEP/CPAP: 5 cmH2O
PO2 ART: 148 mmHg — AB (ref 83.0–108.0)
Pressure control: 15 cmH2O
RATE: 16 resp/min
pCO2 arterial: 29 mmHg — ABNORMAL LOW (ref 32.0–48.0)
pH, Arterial: 7.45 (ref 7.350–7.450)

## 2015-05-15 LAB — HEPATIC FUNCTION PANEL
ALT: <5 U/L — ABNORMAL LOW (ref 17–63)
AST: <5 U/L — ABNORMAL LOW (ref 15–41)
Albumin: 2.7 g/dL — ABNORMAL LOW (ref 3.5–5.0)
Alkaline Phosphatase: 159 U/L — ABNORMAL HIGH (ref 38–126)
BILIRUBIN DIRECT: 0.8 mg/dL — AB (ref 0.1–0.5)
BILIRUBIN TOTAL: 2.3 mg/dL — AB (ref 0.3–1.2)
Indirect Bilirubin: 1.5 mg/dL — ABNORMAL HIGH (ref 0.3–0.9)
Total Protein: 5.2 g/dL — ABNORMAL LOW (ref 6.5–8.1)

## 2015-05-15 LAB — GLUCOSE, CAPILLARY
GLUCOSE-CAPILLARY: 258 mg/dL — AB (ref 65–99)
GLUCOSE-CAPILLARY: 245 mg/dL — AB (ref 65–99)
GLUCOSE-CAPILLARY: 185 mg/dL — AB (ref 65–99)
GLUCOSE-CAPILLARY: 193 mg/dL — AB (ref 65–99)
GLUCOSE-CAPILLARY: 189 mg/dL — AB (ref 65–99)
GLUCOSE-CAPILLARY: 168 mg/dL — AB (ref 65–99)
Glucose-Capillary: 150 mg/dL — ABNORMAL HIGH (ref 65–99)
Glucose-Capillary: 163 mg/dL — ABNORMAL HIGH (ref 65–99)
Glucose-Capillary: 175 mg/dL — ABNORMAL HIGH (ref 65–99)
Glucose-Capillary: 190 mg/dL — ABNORMAL HIGH (ref 65–99)
Glucose-Capillary: 234 mg/dL — ABNORMAL HIGH (ref 65–99)
Glucose-Capillary: 247 mg/dL — ABNORMAL HIGH (ref 65–99)
Glucose-Capillary: 307 mg/dL — ABNORMAL HIGH (ref 65–99)

## 2015-05-15 LAB — RENAL FUNCTION PANEL
Albumin: 2.9 g/dL — ABNORMAL LOW (ref 3.5–5.0)
Albumin: 2.8 g/dL — ABNORMAL LOW (ref 3.5–5.0)
Anion gap: 15 (ref 5–15)
Anion gap: 14 (ref 5–15)
BUN: 25 mg/dL — AB (ref 6–20)
BUN: 22 mg/dL — ABNORMAL HIGH (ref 6–20)
CALCIUM: 7.4 mg/dL — AB (ref 8.9–10.3)
CALCIUM: 7.6 mg/dL — AB (ref 8.9–10.3)
CHLORIDE: 110 mmol/L (ref 101–111)
CHLORIDE: 111 mmol/L (ref 101–111)
CO2: 21 mmol/L — AB (ref 22–32)
CO2: 23 mmol/L (ref 22–32)
CREATININE: 4.68 mg/dL — AB (ref 0.61–1.24)
CREATININE: 4.25 mg/dL — AB (ref 0.61–1.24)
GFR calc Af Amer: 15 mL/min — ABNORMAL LOW (ref >60)
GFR calc Af Amer: 17 mL/min — ABNORMAL LOW (ref >60)
GFR calc non Af Amer: 13 mL/min — ABNORMAL LOW (ref >60)
GFR calc non Af Amer: 15 mL/min — ABNORMAL LOW (ref >60)
GLUCOSE: 281 mg/dL — AB (ref 65–99)
GLUCOSE: 213 mg/dL — AB (ref 65–99)
Phosphorus: 2.5 mg/dL (ref 2.5–4.6)
Phosphorus: 2.3 mg/dL — ABNORMAL LOW (ref 2.5–4.6)
Potassium: 2.9 mmol/L — CL (ref 3.5–5.1)
Potassium: 2.9 mmol/L — CL (ref 3.5–5.1)
SODIUM: 146 mmol/L — AB (ref 135–145)
SODIUM: 148 mmol/L — AB (ref 135–145)

## 2015-05-15 LAB — HIV ANTIBODY (ROUTINE TESTING W REFLEX): HIV Screen 4th Generation wRfx: NONREACTIVE

## 2015-05-15 LAB — URINE CULTURE

## 2015-05-15 LAB — THYROID PANEL WITH TSH
FREE THYROXINE INDEX: 1.1 — AB (ref 1.2–4.9)
T3 UPTAKE RATIO: 42 % — AB (ref 24–39)
T4 TOTAL: 2.7 ug/dL — AB (ref 4.5–12.0)
TSH: 14.96 u[IU]/mL — ABNORMAL HIGH (ref 0.450–4.500)

## 2015-05-15 LAB — LACTIC ACID, PLASMA: LACTIC ACID, VENOUS: 3.7 mmol/L — AB (ref 0.5–2.0)

## 2015-05-15 LAB — PROCALCITONIN: PROCALCITONIN: 18.34 ng/mL

## 2015-05-15 LAB — MAGNESIUM: Magnesium: 1.7 mg/dL (ref 1.7–2.4)

## 2015-05-15 MED ORDER — POTASSIUM CHLORIDE 10 MEQ/50ML IV SOLN
10.0000 meq | INTRAVENOUS | Status: DC
  Administered 2015-05-15 (×4): 10 meq via INTRAVENOUS
  Filled 2015-05-15 (×6): qty 50

## 2015-05-15 MED ORDER — MAGNESIUM SULFATE 2 GM/50ML IV SOLN
2.0000 g | Freq: Once | INTRAVENOUS | Status: AC
  Administered 2015-05-15: 2 g via INTRAVENOUS
  Filled 2015-05-15: qty 50

## 2015-05-15 MED ORDER — DEXTROSE-NACL 5-0.45 % IV SOLN
INTRAVENOUS | Status: DC
  Administered 2015-05-15: 06:00:00 via INTRAVENOUS

## 2015-05-15 MED ORDER — INSULIN ASPART 100 UNIT/ML ~~LOC~~ SOLN
0.0000 [IU] | SUBCUTANEOUS | Status: DC
  Administered 2015-05-15: 4 [IU] via SUBCUTANEOUS
  Filled 2015-05-15: qty 4

## 2015-05-15 MED ORDER — INSULIN GLARGINE 100 UNIT/ML ~~LOC~~ SOLN
30.0000 [IU] | Freq: Two times a day (BID) | SUBCUTANEOUS | Status: DC
  Filled 2015-05-15 (×2): qty 0.3

## 2015-05-18 LAB — CULTURE, BLOOD (ROUTINE X 2)
CULTURE: NO GROWTH
Culture: NO GROWTH

## 2015-05-30 NOTE — Progress Notes (Signed)
NEUROLOGY NOTE  Pt had episode last night of sudden bradycardia.  Now pt has no exam  CT personally reviewed by me and shows diffuse cerebral edema with loss of gray-white border.  EEG shows complete suppression  A/P: 1.  Severe anoxic injury-  Eeg, ct and exam go along with brain death now -  intensivist to inform family about prognosis -  We are available for repeat brain death exam if needed -  Will sign off, please call with questions

## 2015-05-30 NOTE — Progress Notes (Signed)
   05/24/15 1230  Clinical Encounter Type  Visited With Patient and family together  Visit Type Spiritual support  Referral From Nurse  Consult/Referral To Chaplain  Spiritual Encounters  Spiritual Needs Prayer  Met with family, and had prayer. Chap. Evern Bio [phone]

## 2015-05-30 NOTE — Progress Notes (Addendum)
Spoke with elink concerning pt's critical potassium level of 2.9.  Potassium level is addressed via dialysate bags, unless pt starts having issues with ectopy.  Will continue to monitor.  Also, order to obtain CT of head was addressed with elink.  Pt at this time is too unstable to transport for head CT.  Advised to pass in report to notify neurology.

## 2015-05-30 NOTE — Progress Notes (Signed)
Luis Perry with [employer] at bedside with family discussing potential organ donation.

## 2015-05-30 NOTE — Progress Notes (Signed)
Patient ID: Luis Perry, male   DOB: 1966-03-30, 49 y.o.   MRN: [id number] [facility] PROGRESS NOTE  PCP: No primary care provider on file.  HPI/Subjective: Patient unresponsive on the ventilator without sedation.  Objective: Filed Vitals:   Jun 13, 2015 1100  BP: 105/80  Pulse:   Temp: 97.3 F (36.3 C)  Resp: 20    Filed Weights   05/14/2015 1522 06-13-2015 0500  Weight: 99.791 kg (220 lb) 118.9 kg (262 lb 2 oz)    ROS: Review of Systems  Unable to perform ROS  patient unresponsive Exam: Physical Exam  HENT:  Nose: No mucosal edema.  Eyes: Conjunctivae, EOM and lids are normal.  Pupils dilated and fixed  Neck: No JVD present. Carotid bruit is not present. No edema present. No thyroid mass and no thyromegaly present.  Cardiovascular: S1 normal and S2 normal.  Exam reveals no gallop.   No murmur heard. Pulses:      Dorsalis pedis pulses are 2+ on the right side, and 2+ on the left side.  Respiratory: No respiratory distress. He has no wheezes. He has rhonchi in the right lower field and the left lower field. He has no rales.  GI: Soft. Bowel sounds are normal. There is no tenderness.  Musculoskeletal:       Right ankle: He exhibits swelling.       Left ankle: He exhibits swelling.  Lymphadenopathy:    He has no cervical adenopathy.  Neurological: He is unresponsive.  Unresponsive to painful stimuli  Skin: Skin is warm. Nails show no clubbing.  Fungal rash in the groin  Psychiatric:  Unresponsive to painful stimuli    Data Reviewed: Basic Metabolic Panel:  Recent Labs Lab 05/14/15 1020 05/14/15 1340 05/14/15 1740 06/13/2015 0152 13-Jun-2015 0549 06/13/2015 0550  NA 143 144 147* 146*  --  148*  K 2.7* 2.4* 2.8* 2.9*  --  2.9*  CL 111 110 109 110  --  111  CO2 14* 14* 19* 21*  --  23  GLUCOSE 898* 814* 590* 281*  --  213*  BUN 38* 37* 32* 25*  --  22*  CREATININE 5.19* 5.41* 5.51* 4.68*  --  4.25*  CALCIUM 6.6* 6.6* 6.9* 7.4*  --  7.6*  MG  --    --   --   --  1.7  --   PHOS <1.0* <1.0* 1.5* 2.5  --  2.3*   Liver Function Tests:  Recent Labs Lab 05/28/2015 1504  05/14/15 1340 05/14/15 1740 06-13-15 0152 2015-06-13 0549 06-13-2015 0550  AST 52*  --   --   --   --  <5*  --   ALT 22  --   --   --   --  <5*  --   ALKPHOS 149*  --   --   --   --  159*  --   BILITOT 1.3*  --   --   --   --  2.3*  --   PROT 8.5*  --   --   --   --  5.2*  --   ALBUMIN 4.6  < > 2.4* 2.7* 2.9* 2.7* 2.8*  < > = values in this interval not displayed.  Recent Labs Lab 05/04/2015 1640  AMMONIA 283*   CBC:  Recent Labs Lab 05/29/2015 1504 05/14/15 1421 June 13, 2015 0549  WBC 28.4* 8.8 14.1*  NEUTROABS 20.2*  --   --   HGB 13.8 11.0* 12.9*  HCT 48.8 33.0* 37.5*  MCV 105.7* 89.1 87.6  PLT 160 74* 50*   Cardiac Enzymes:  Recent Labs Lab 06-05-2015 1504 June 05, 2015 1925 05/14/15 0204 05/14/15 0443 05/14/15 1020  TROPONINI <0.03 <0.03 0.04* 0.07* 0.23*    CBG:  Recent Labs Lab 05/16/2015 0722 05/08/2015 0822 05/20/2015 0923 05/23/2015 1013 05/19/2015 1120  GLUCAP 150* 163* 193* 189* 190*    Recent Results (from the past 240 hour(s))  Urine culture     Status: None   Collection Time: Jun 05, 2015  3:52 PM  Result Value Ref Range Status   Specimen Description URINE, RANDOM  Final   Special Requests NONE  Final   Culture   Final    40,000 COLONIES/ml GRAM POSITIVE RODS NO FURTHER ID    Report Status 05/29/2015 FINAL  Final  Culture, blood (routine x 2)     Status: None (Preliminary result)   Collection Time: 2015-06-05  6:08 PM  Result Value Ref Range Status   Specimen Description BLOOD RIGHT ASSIST CONTROL  Final   Special Requests BOTTLES DRAWN AEROBIC AND ANAEROBIC  7CC  Final   Culture NO GROWTH 2 DAYS  Final   Report Status PENDING  Incomplete  Culture, blood (routine x 2)     Status: None (Preliminary result)   Collection Time: Jun 05, 2015  6:34 PM  Result Value Ref Range Status   Specimen Description BLOOD RIGHT ASSIST CONTROL  Final   Special  Requests BOTTLES DRAWN AEROBIC AND ANAEROBIC  7CC  Final   Culture NO GROWTH 2 DAYS  Final   Report Status PENDING  Incomplete  MRSA PCR Screening     Status: None   Collection Time: 05/14/15  2:48 AM  Result Value Ref Range Status   MRSA by PCR NEGATIVE NEGATIVE Final    Comment:        The GeneXpert MRSA Assay (FDA approved for NASAL specimens only), is one component of a comprehensive MRSA colonization surveillance program. It is not intended to diagnose MRSA infection nor to guide or monitor treatment for MRSA infections.      Studies: Dg Abd 1 View  05/14/2015   CLINICAL DATA:  OG tube placement.  EXAM: ABDOMEN - 1 VIEW  COMPARISON:  None.  FINDINGS: Tip of the enteric tube is in the distal stomach. The side port is below the diaphragm. No dilated bowel loops to suggest obstruction. There is mild motion artifact.  IMPRESSION: Tip and side port of the enteric tube below the diaphragm in the stomach.   Electronically Signed   By: Rubye Oaks M.D.   On: 05/14/2015 06:32   Ct Head Wo Contrast  05/24/2015   CLINICAL DATA:  Diabetic patient with hypoxic ischemic event occurring 06/05/15. Patient was found at home hypothermic, hypotensive, hyperglycemia, and hypoxic. Currently unresponsive.  EXAM: CT HEAD WITHOUT CONTRAST  TECHNIQUE: Contiguous axial images were obtained from the base of the skull through the vertex without intravenous contrast.  COMPARISON:  CT head 2015-06-05.  FINDINGS: Signs of marked increased intracranial pressure, with obliteration of the basilar cisterns, cortical sulci, and squeezing of the ventricles centrally, including the third and fourth ventricles. There is the appearance of pseudosubarachnoid hemorrhage, with prominence of the falx and tentorium as well as the central large vessels, due to hypoattenuation of the brain. There is loss of gray-white attenuation throughout the supratentorial compartment as well as the posterior fossa. There is diffuse  cerebral edema, most notably affecting the cortex, basal ganglia, and cerebellum. There is no midline shift, as the process is  symmetric. Marked worsening in comparison with the initial CT head which was normal.  Calvarium intact.  No sinus or mastoid disease.  IMPRESSION: Widespread areas of developing hypoattenuation of the brain, loss of gray-white differentiation, consistent with a severe hypoxic ischemic event. Obliteration of the basilar cisterns, and squeezing of the ventricles suggests severe increased intracranial pressure, possibly incipient herniation.  Findings discussed with Critical Care physician Dr. Sung Amabile.   Electronically Signed   By: Elsie Stain M.D.   On: 05/08/2015 10:33   Ct Head Wo Contrast  19-May-2015   CLINICAL DATA:  Unresponsive  EXAM: CT HEAD WITHOUT CONTRAST  TECHNIQUE: Contiguous axial images were obtained from the base of the skull through the vertex without intravenous contrast.  COMPARISON:  None.  FINDINGS: There is no midline shift, hydrocephalus, or mass. No acute hemorrhage or acute transcortical infarct is identified. There is left maxillary sinus retention cyst. Mild mucoperiosteal thickening of the right ethmoid sinuses identified. The bony calvarium is intact.  IMPRESSION: No focal acute intracranial abnormality identified.   Electronically Signed   By: Sherian Rein M.D.   On: 19-May-2015 16:05   Dg Chest Port 1 View  05/17/2015   CLINICAL DATA:  Respiratory failure.  EXAM: PORTABLE CHEST - 1 VIEW  COMPARISON:  05/14/2015.  FINDINGS: Endotracheal tube, NG tube, right IJ line stable position. Mediastinum hilar structures normal. Heart size stable. Low lung volumes with mild bibasilar atelectasis. No pleural effusion or pneumothorax .  IMPRESSION: 1. Lines and tubes in stable position. 2. Low lung volumes with mild bibasilar atelectasis.   Electronically Signed   By: Maisie Fus  Register   On: 05/23/2015 07:18   Dg Chest Port 1 View  05/14/2015   CLINICAL DATA:  Central  line placement.  EXAM: PORTABLE CHEST - 1 VIEW  COMPARISON:  2015/05/19  FINDINGS: Endotracheal tube has tip 6.5 cm above the carina. Interval placement of nasogastric tube which courses through the region of the stomach and off the inferior portion of the film. Left IJ central venous catheter has been pulled back slightly with tip over the SVC at the level of the carina. Interval placement of right IJ central venous catheter with tip at approximately the cavoatrial junction.  Lungs are hypoinflated without consolidation, effusion or pneumothorax. Cardiomediastinal silhouette and remainder of the exam is unchanged.  IMPRESSION: Hypoinflation without acute cardiopulmonary disease.  Tubes and lines as described. Interval placement of right IJ central venous catheter with tip over the cavoatrial junction.   Electronically Signed   By: Elberta Fortis M.D.   On: 05/14/2015 16:30   Dg Chest Port 1 View  2015/05/19   CLINICAL DATA:  Acute respiratory failure with hypoxia. Central line placement.  EXAM: PORTABLE CHEST - 1 VIEW  COMPARISON:  05-19-15  FINDINGS: New left jugular central venous catheter is seen with tip overlying the superior cavoatrial junction. Endotracheal tube remains in appropriate position. No pneumothorax visualized. Low lung volumes are seen however both lungs remain clear. Heart size is within normal limits.  IMPRESSION: New left jugular central venous catheter in appropriate position. No pneumothorax visualized.  Low lung volumes.  No active lung disease.   Electronically Signed   By: Myles Rosenthal M.D.   On: 2015-05-19 19:56   Dg Chest Port 1 View  May 19, 2015   CLINICAL DATA:  Altered mental status.  Intubation.  EXAM: PORTABLE CHEST - 1 VIEW  COMPARISON:  Chest x-ray dated 02/22/2010  FINDINGS: Endotracheal tube is in good position. Heart size and pulmonary  vascularity are normal. Lungs are clear. No acute osseous abnormality.  IMPRESSION: Endotracheal tube in good position.  No visible acute  abnormalities.   Electronically Signed   By: Francene Boyers M.D.   On: 05/04/2015 15:48    Scheduled Meds: . antiseptic oral rinse  7 mL Mouth Rinse QID  . chlorhexidine gluconate  15 mL Mouth Rinse BID  . insulin aspart  0-20 Units Subcutaneous 6 times per day  . insulin glargine  30 Units Subcutaneous BID  . pantoprazole (PROTONIX) IV  40 mg Intravenous Q24H  . piperacillin-tazobactam (ZOSYN)  IV  3.375 g Intravenous 3 times per day  . potassium chloride  10 mEq Intravenous Q1 Hr x 6  . potassium chloride  40 mEq Oral Once  . vancomycin  1,000 mg Intravenous Q24H   Continuous Infusions: . sodium chloride    . DOPamine 3 mcg/kg/min (05/14/15 2018)  . insulin (NOVOLIN-R) infusion 23.4 Units/hr (08-Jun-2015 1122)  . norepinephrine (LEVOPHED) Adult infusion 40 mcg/min (06/08/15 1031)  . pureflow 2,000 mL/hr at 2015-06-08 0626  . vasopressin (PITRESSIN) infusion - *FOR SHOCK* Stopped (05/14/15 1215)    Assessment/Plan:  1. Severe anoxic encepahlopathy Brain death - organ donation team to meet with family today for organ donation versus withdrawal of care. 2. Shock. Septic versus hypovolemic.  3. Lactic acidosis and severe acidosis- 4. Oliguric acute renal failure with worsening creatinine- CRRT for now 5. Diabetic ketoacidosis with severely high sugars- patient is on insulin drip. Appreciate endocrine consultation. 6. Acute respiratory failure- no sponatneous respirations  Code Status:     Code Status Orders        Start     Ordered   05/01/2015 1812  Full code   Continuous     05/08/2015 1829     Family Communication: Fiance at bedside Disposition Plan: To be determined  Consultants:  Critical care specialist  Nephrology  Endocrinology  Neurology  Antibiotics:  Vancomycin  Zosyn  Micafungin  Time spent:  Alford Highland  [facility]

## 2015-05-30 NOTE — Progress Notes (Signed)
Subjective:   Patient remains critically ill Ventilatory support CRRT eventually started with some difficulty because of high access pressures. New dialysis catheter had to be placed. Blood sugars are somewhat better controlled now Hemoglobin A1c of 13.5 noted   Objective:  Vital signs in last 24 hours:  Temp:  [95.5 F (35.3 C)-100.2 F (37.9 C)] 96.6 F (35.9 C) (09/16 0800) Pulse Rate:  [26-110] 26 (09/16 0800) Resp:  [16-40] 16 (09/16 0800) BP: (73-136)/(49-92) 73/49 mmHg (09/16 0800) SpO2:  [100 %] 100 % (09/16 0800) Arterial Line BP: (93-154)/(54-92) 94/54 mmHg (09/16 0800) FiO2 (%):  [30 %-40 %] 30 % (09/16 0834) Weight:  [118.9 kg (262 lb 2 oz)] 118.9 kg (262 lb 2 oz) (09/16 0500)  Weight change: 19.109 kg (42 lb 2 oz) Filed Weights   05/06/2015 1522 2015/06/12 0500  Weight: 99.791 kg (220 lb) 118.9 kg (262 lb 2 oz)    Intake/Output:    Intake/Output Summary (Last 24 hours) at Jun 12, 2015 0924 Last data filed at Jun 12, 2015 0800  Gross per 24 hour  Intake 4745.98 ml  Output    150 ml  Net 4595.98 ml     Physical Exam: General:  critically ill-appearing,   HEENT  pupils are 6-7 mm, no reaction to light noted, cataracts bilaterally anicteric   Neck  no masses   Pulm/lungs  ventilator assisted, no crackles   CVS/Heart  tachycardic, regular   Abdomen:   soft, nontender, nondistended   Extremities:  trace peripheral edema   Neurologic:  not following commands, not responding to verbal or tactile stimuli   Skin:  no acute rashes, mottling over bilateral lower extremities below the knee   Access:  Vas-Cath        Basic Metabolic Panel:   Recent Labs Lab 05/14/15 1020 05/14/15 1340 05/14/15 1740 06/12/2015 0152 06-12-2015 0549 June 12, 2015 0550  NA 143 144 147* 146*  --  148*  K 2.7* 2.4* 2.8* 2.9*  --  2.9*  CL 111 110 109 110  --  111  CO2 14* 14* 19* 21*  --  23  GLUCOSE 898* 814* 590* 281*  --  213*  BUN 38* 37* 32* 25*  --  22*  CREATININE 5.19* 5.41*  5.51* 4.68*  --  4.25*  CALCIUM 6.6* 6.6* 6.9* 7.4*  --  7.6*  MG  --   --   --   --  1.7  --   PHOS <1.0* <1.0* 1.5* 2.5  --  2.3*     CBC:  Recent Labs Lab 05/12/2015 1504 05/14/15 1421 12-Jun-2015 0549  WBC 28.4* 8.8 14.1*  NEUTROABS 20.2*  --   --   HGB 13.8 11.0* 12.9*  HCT 48.8 33.0* 37.5*  MCV 105.7* 89.1 87.6  PLT 160 74* 50*      Microbiology:  Recent Results (from the past 720 hour(s))  Urine culture     Status: None (Preliminary result)   Collection Time: 05/16/2015  3:52 PM  Result Value Ref Range Status   Specimen Description URINE, RANDOM  Final   Special Requests NONE  Final   Culture NO GROWTH  Final   Report Status PENDING  Incomplete  Culture, blood (routine x 2)     Status: None (Preliminary result)   Collection Time: 05/03/2015  6:08 PM  Result Value Ref Range Status   Specimen Description BLOOD RIGHT ASSIST CONTROL  Final   Special Requests BOTTLES DRAWN AEROBIC AND ANAEROBIC  7CC  Final   Culture NO GROWTH  2 DAYS  Final   Report Status PENDING  Incomplete  Culture, blood (routine x 2)     Status: None (Preliminary result)   Collection Time: June 10, 2015  6:34 PM  Result Value Ref Range Status   Specimen Description BLOOD RIGHT ASSIST CONTROL  Final   Special Requests BOTTLES DRAWN AEROBIC AND ANAEROBIC  7CC  Final   Culture NO GROWTH 2 DAYS  Final   Report Status PENDING  Incomplete  MRSA PCR Screening     Status: None   Collection Time: 05/14/15  2:48 AM  Result Value Ref Range Status   MRSA by PCR NEGATIVE NEGATIVE Final    Comment:        The GeneXpert MRSA Assay (FDA approved for NASAL specimens only), is one component of a comprehensive MRSA colonization surveillance program. It is not intended to diagnose MRSA infection nor to guide or monitor treatment for MRSA infections.     Coagulation Studies:  Recent Labs  2015/06/10 1504 10-Jun-2015 1925  LABPROT 19.0* 22.5*  INR 1.57 1.96    Urinalysis:  Recent Labs  2015/06/10 1504  06-10-2015 2320  COLORURINE STRAW* AMBER*  LABSPEC 1.018 1.014  PHURINE 5.0 5.0  GLUCOSEU >500* >500*  HGBUR 2+* 3+*  BILIRUBINUR NEGATIVE NEGATIVE  KETONESUR 2+* TRACE*  PROTEINUR 100* >500*  NITRITE NEGATIVE NEGATIVE  LEUKOCYTESUR NEGATIVE NEGATIVE      Imaging: Dg Abd 1 View  05/14/2015   CLINICAL DATA:  OG tube placement.  EXAM: ABDOMEN - 1 VIEW  COMPARISON:  None.  FINDINGS: Tip of the enteric tube is in the distal stomach. The side port is below the diaphragm. No dilated bowel loops to suggest obstruction. There is mild motion artifact.  IMPRESSION: Tip and side port of the enteric tube below the diaphragm in the stomach.   Electronically Signed   By: Rubye Oaks M.D.   On: 05/14/2015 06:32   Ct Head Wo Contrast  06/10/2015   CLINICAL DATA:  Unresponsive  EXAM: CT HEAD WITHOUT CONTRAST  TECHNIQUE: Contiguous axial images were obtained from the base of the skull through the vertex without intravenous contrast.  COMPARISON:  None.  FINDINGS: There is no midline shift, hydrocephalus, or mass. No acute hemorrhage or acute transcortical infarct is identified. There is left maxillary sinus retention cyst. Mild mucoperiosteal thickening of the right ethmoid sinuses identified. The bony calvarium is intact.  IMPRESSION: No focal acute intracranial abnormality identified.   Electronically Signed   By: Sherian Rein M.D.   On: 2015/06/10 16:05   Dg Chest Port 1 View  05/28/2015   CLINICAL DATA:  Respiratory failure.  EXAM: PORTABLE CHEST - 1 VIEW  COMPARISON:  05/14/2015.  FINDINGS: Endotracheal tube, NG tube, right IJ line stable position. Mediastinum hilar structures normal. Heart size stable. Low lung volumes with mild bibasilar atelectasis. No pleural effusion or pneumothorax .  IMPRESSION: 1. Lines and tubes in stable position. 2. Low lung volumes with mild bibasilar atelectasis.   Electronically Signed   By: Maisie Fus  Register   On: 05/10/2015 07:18   Dg Chest Port 1 View  05/14/2015    CLINICAL DATA:  Central line placement.  EXAM: PORTABLE CHEST - 1 VIEW  COMPARISON:  06/10/15  FINDINGS: Endotracheal tube has tip 6.5 cm above the carina. Interval placement of nasogastric tube which courses through the region of the stomach and off the inferior portion of the film. Left IJ central venous catheter has been pulled back slightly with tip over the SVC at  the level of the carina. Interval placement of right IJ central venous catheter with tip at approximately the cavoatrial junction.  Lungs are hypoinflated without consolidation, effusion or pneumothorax. Cardiomediastinal silhouette and remainder of the exam is unchanged.  IMPRESSION: Hypoinflation without acute cardiopulmonary disease.  Tubes and lines as described. Interval placement of right IJ central venous catheter with tip over the cavoatrial junction.   Electronically Signed   By: Elberta Fortis M.D.   On: 05/14/2015 16:30   Dg Chest Port 1 View  05/23/2015   CLINICAL DATA:  Acute respiratory failure with hypoxia. Central line placement.  EXAM: PORTABLE CHEST - 1 VIEW  COMPARISON:  05/17/2015  FINDINGS: New left jugular central venous catheter is seen with tip overlying the superior cavoatrial junction. Endotracheal tube remains in appropriate position. No pneumothorax visualized. Low lung volumes are seen however both lungs remain clear. Heart size is within normal limits.  IMPRESSION: New left jugular central venous catheter in appropriate position. No pneumothorax visualized.  Low lung volumes.  No active lung disease.   Electronically Signed   By: Myles Rosenthal M.D.   On: 05/20/2015 19:56   Dg Chest Port 1 View  05/04/2015   CLINICAL DATA:  Altered mental status.  Intubation.  EXAM: PORTABLE CHEST - 1 VIEW  COMPARISON:  Chest x-ray dated 02/22/2010  FINDINGS: Endotracheal tube is in good position. Heart size and pulmonary vascularity are normal. Lungs are clear. No acute osseous abnormality.  IMPRESSION: Endotracheal tube in good  position.  No visible acute abnormalities.   Electronically Signed   By: Francene Boyers M.D.   On: 05/20/2015 15:48     Medications:   . sodium chloride    . dextrose 5 % and 0.45% NaCl 50 mL/hr at 2015-05-26 0622  . DOPamine 3 mcg/kg/min (05/14/15 2018)  . fentaNYL infusion INTRAVENOUS Stopped (May 26, 2015 0700)  . insulin (NOVOLIN-R) infusion 15.5 Units/hr (26-May-2015 0823)  . norepinephrine (LEVOPHED) Adult infusion 45 mcg/min (2015-05-26 0755)  . pureflow 2,000 mL/hr at 2015-05-26 0626  . vasopressin (PITRESSIN) infusion - *FOR SHOCK* Stopped (05/14/15 1215)   . antiseptic oral rinse  7 mL Mouth Rinse QID  . chlorhexidine gluconate  15 mL Mouth Rinse BID  . hydrocortisone sod succinate (SOLU-CORTEF) inj  50 mg Intravenous Q12H  . lacosamide (VIMPAT) IV  100 mg Intravenous Q12H  . micafungin (MYCAMINE) IV  100 mg Intravenous Daily  . pantoprazole (PROTONIX) IV  40 mg Intravenous Q24H  . piperacillin-tazobactam (ZOSYN)  IV  3.375 g Intravenous 3 times per day  . potassium chloride  40 mEq Oral Once  . vancomycin  1,000 mg Intravenous Q24H   docusate sodium, heparin, iohexol  Assessment/ Plan:  49 y.o. male with a PMHX of Newly diagnosed DM, recent rt hip surgery (july 2016), was admitted on 05/27/2015 with DKA, ARF, Acute resp failure.   1. ARF, likely ATN from severe concurrent illness. Oliguric 2. Severe lactic acidosis ? Related to acute illness, leading to ARF but patient kept taking metformin which might have led to this illness 3. Acute resp failure - vent assisted 4. Hypernatremia 5. DKA - Poorly controlled diabetes with A1c of 13.5% 6. Hypokalemia - Despite 4 K dialysate 7. Altered mental status - Neurology evaluation ongoing - plan for CT head later this morning  Plan:  continue CRRT to correct acidemia (consent obtained from patient's mother) No UF at present Potassium supplementation, magnesium supplementation Will follow   LOS: 2 Lynann Demetrius [id number]  AM

## 2015-05-30 NOTE — Progress Notes (Signed)
Exam c/w brain death CT head revealed severe cerebral edema EEG flatline Neurology eval and impression noted I spoke at length with extended family re: the diagnosis of brain death Evaluated by CDS - deemed not a candidate for donation Orders placed for discontinuation of all supportive therapies and extubation  CRITICAL CARE Performed by: Billy Fischer   Total critical care time: 50  Critical care time was exclusive of separately billable procedures and treating other patients.  Critical care was necessary to treat or prevent imminent or life-threatening deterioration.  Critical care was time spent personally by me on the following activities: development of treatment plan with patient and/or surrogate as well as nursing, discussions with consultants, evaluation of patient's response to treatment, examination of patient, obtaining history from patient or surrogate, ordering and performing treatments and interventions, ordering and review of laboratory studies, ordering and review of radiographic studies, pulse oximetry and re-evaluation of patient's condition.   Billy Fischer, MD PCCM service Mobile [phone] Pager [phone]

## 2015-05-30 NOTE — Progress Notes (Signed)
Luis Perry in to update family that patient is not a candidate to donate any organs due to sepsis.

## 2015-05-30 NOTE — Progress Notes (Signed)
Patient terminally extubated by Lucille Passy from cardiopulmonry.

## 2015-05-30 NOTE — Discharge Summary (Signed)
[facility] [place] [facility]   PATIENT NAME: Luis Perry    MR#:  [id number]  DATE OF BIRTH:  1966-05-12  DATE OF ADMISSION:  May 20, 2015 ADMITTING PHYSICIAN: Adrian Saran, MD  DATE OF death: May 22, 2015  PRIMARY CARE PHYSICIAN: No primary care provider on file.    ADMISSION DIAGNOSIS:  Hyperglycemia [R73.9] Acute respiratory failure with hypoxia [J96.01] Emergency department triage assessment of serious [Z78.9] Hypotension, unspecified hypotension type [I95.9] Diabetic ketoacidosis with coma associated with type 2 diabetes mellitus [E13.11]  DISCHARGE DIAGNOSIS:  Active Problems:   Hyperglycemia   DKA (diabetic ketoacidoses)   Acute respiratory failure with hypoxia   Dyspnea   Arterial hypotension   AKI (acute kidney injury)   SECONDARY DIAGNOSIS:   Past Medical History  Diagnosis Date  . SOB (shortness of breath)   . Tachycardia   . Chest pain   . Hypertension   . Diabetes     HOSPITAL COURSE:   1. 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 Patient had severe cerebral edema on repeat CT scan. EEG consistent with brain death and physical exam consistent with brain death. Patient was seen in consultation by Dr. Katrinka Blazing neurology. Withdrawal of care was done on May 22, 2015. Patient pronounced dead at 1450 6 PM. 2. Shock- unclear if it was septic versus hypovolemic. The patient was on IV fluids and pressors the entire hospital course. 3. Severe acidosis and lactic acidosis. I think this was likely the cause of the patient's severe anoxic encephalopathy and brain death. The patient was in diabetic ketoacidosis. A lactic acidosis could be secondary to the patient's metformin that he was taking. 4. Acute respiratory failure with hypoxia- the patient was on the ventilator entire hospital course and no spontaneous respirations. 5. Oliguric acute renal failure likely secondary to shock patient started  continuous dialysis on 05/14/2015. 6. Thrombocytopenia- possibility of DIC 7. Diabetic ketoacidosis- the patient was on insulin drip during the entire course. 8. Severe hypokalemia- high potassium bath was used with the CRRT.  CONSULTS OBTAINED:  Treatment Team:  Mosetta Pigeon, MD Abby Lanetta Inch, MD  DRUG ALLERGIES:  No Known Allergies  DATA REVIEW:   CBC  Recent Labs Lab 2015-05-22 0549  WBC 14.1*  HGB 12.9*  HCT 37.5*  PLT 50*    Chemistries   Recent Labs Lab 22-May-2015 0549 May 22, 2015 0550  NA  --  148*  K  --  2.9*  CL  --  111  CO2  --  23  GLUCOSE  --  213*  BUN  --  22*  CREATININE  --  4.25*  CALCIUM  --  7.6*  MG 1.7  --   AST <5*  --   ALT <5*  --   ALKPHOS 159*  --   BILITOT 2.3*  --     Cardiac Enzymes  Recent Labs Lab 05/14/15 1020  TROPONINI 0.23*    Microbiology Results  Results for orders placed or performed during the hospital encounter of 05-20-2015  Urine culture     Status: None   Collection Time: 05/20/2015  3:52 PM  Result Value Ref Range Status   Specimen Description URINE, RANDOM  Final   Special Requests NONE  Final   Culture   Final    40,000 COLONIES/ml GRAM POSITIVE RODS NO FURTHER ID    Report Status 05-22-15 FINAL  Final  Culture, blood (routine x 2)     Status: None (Preliminary result)  Collection Time: 05/22/2015  6:08 PM  Result Value Ref Range Status   Specimen Description BLOOD RIGHT ASSIST CONTROL  Final   Special Requests BOTTLES DRAWN AEROBIC AND ANAEROBIC  7CC  Final   Culture NO GROWTH 2 DAYS  Final   Report Status PENDING  Incomplete  Culture, blood (routine x 2)     Status: None (Preliminary result)   Collection Time: 05/22/15  6:34 PM  Result Value Ref Range Status   Specimen Description BLOOD RIGHT ASSIST CONTROL  Final   Special Requests BOTTLES DRAWN AEROBIC AND ANAEROBIC  7CC  Final   Culture NO GROWTH 2 DAYS  Final   Report Status PENDING  Incomplete  MRSA PCR Screening     Status: None    Collection Time: 05/14/15  2:48 AM  Result Value Ref Range Status   MRSA by PCR NEGATIVE NEGATIVE Final    Comment:        The GeneXpert MRSA Assay (FDA approved for NASAL specimens only), is one component of a comprehensive MRSA colonization surveillance program. It is not intended to diagnose MRSA infection nor to guide or monitor treatment for MRSA infections.     RADIOLOGY:  Dg Abd 1 View  05/14/2015   CLINICAL DATA:  OG tube placement.  EXAM: ABDOMEN - 1 VIEW  COMPARISON:  None.  FINDINGS: Tip of the enteric tube is in the distal stomach. The side port is below the diaphragm. No dilated bowel loops to suggest obstruction. There is mild motion artifact.  IMPRESSION: Tip and side port of the enteric tube below the diaphragm in the stomach.   Electronically Signed   By: Rubye Oaks M.D.   On: 05/14/2015 06:32   Ct Head Wo Contrast  04/30/2015   CLINICAL DATA:  Diabetic patient with hypoxic ischemic event occurring 05-22-2015. Patient was found at home hypothermic, hypotensive, hyperglycemia, and hypoxic. Currently unresponsive.  EXAM: CT HEAD WITHOUT CONTRAST  TECHNIQUE: Contiguous axial images were obtained from the base of the skull through the vertex without intravenous contrast.  COMPARISON:  CT head May 22, 2015.  FINDINGS: Signs of marked increased intracranial pressure, with obliteration of the basilar cisterns, cortical sulci, and squeezing of the ventricles centrally, including the third and fourth ventricles. There is the appearance of pseudosubarachnoid hemorrhage, with prominence of the falx and tentorium as well as the central large vessels, due to hypoattenuation of the brain. There is loss of gray-white attenuation throughout the supratentorial compartment as well as the posterior fossa. There is diffuse cerebral edema, most notably affecting the cortex, basal ganglia, and cerebellum. There is no midline shift, as the process is symmetric. Marked worsening in comparison with  the initial CT head which was normal.  Calvarium intact.  No sinus or mastoid disease.  IMPRESSION: Widespread areas of developing hypoattenuation of the brain, loss of gray-white differentiation, consistent with a severe hypoxic ischemic event. Obliteration of the basilar cisterns, and squeezing of the ventricles suggests severe increased intracranial pressure, possibly incipient herniation.  Findings discussed with Critical Care physician Dr. Sung Amabile.   Electronically Signed   By: Elsie Stain M.D.   On: 05/05/2015 10:33   Ct Head Wo Contrast  2015/05/22   CLINICAL DATA:  Unresponsive  EXAM: CT HEAD WITHOUT CONTRAST  TECHNIQUE: Contiguous axial images were obtained from the base of the skull through the vertex without intravenous contrast.  COMPARISON:  None.  FINDINGS: There is no midline shift, hydrocephalus, or mass. No acute hemorrhage or acute transcortical infarct is identified. There  is left maxillary sinus retention cyst. Mild mucoperiosteal thickening of the right ethmoid sinuses identified. The bony calvarium is intact.  IMPRESSION: No focal acute intracranial abnormality identified.   Electronically Signed   By: Sherian Rein M.D.   On: 06/02/15 16:05   Dg Chest Port 1 View  05/05/2015   CLINICAL DATA:  Respiratory failure.  EXAM: PORTABLE CHEST - 1 VIEW  COMPARISON:  05/14/2015.  FINDINGS: Endotracheal tube, NG tube, right IJ line stable position. Mediastinum hilar structures normal. Heart size stable. Low lung volumes with mild bibasilar atelectasis. No pleural effusion or pneumothorax .  IMPRESSION: 1. Lines and tubes in stable position. 2. Low lung volumes with mild bibasilar atelectasis.   Electronically Signed   By: Maisie Fus  Register   On: 05/27/2015 07:18   Dg Chest Port 1 View  05/14/2015   CLINICAL DATA:  Central line placement.  EXAM: PORTABLE CHEST - 1 VIEW  COMPARISON:  02-Jun-2015  FINDINGS: Endotracheal tube has tip 6.5 cm above the carina. Interval placement of nasogastric tube  which courses through the region of the stomach and off the inferior portion of the film. Left IJ central venous catheter has been pulled back slightly with tip over the SVC at the level of the carina. Interval placement of right IJ central venous catheter with tip at approximately the cavoatrial junction.  Lungs are hypoinflated without consolidation, effusion or pneumothorax. Cardiomediastinal silhouette and remainder of the exam is unchanged.  IMPRESSION: Hypoinflation without acute cardiopulmonary disease.  Tubes and lines as described. Interval placement of right IJ central venous catheter with tip over the cavoatrial junction.   Electronically Signed   By: Elberta Fortis M.D.   On: 05/14/2015 16:30   Dg Chest Port 1 View  06/02/2015   CLINICAL DATA:  Acute respiratory failure with hypoxia. Central line placement.  EXAM: PORTABLE CHEST - 1 VIEW  COMPARISON:  06-02-2015  FINDINGS: New left jugular central venous catheter is seen with tip overlying the superior cavoatrial junction. Endotracheal tube remains in appropriate position. No pneumothorax visualized. Low lung volumes are seen however both lungs remain clear. Heart size is within normal limits.  IMPRESSION: New left jugular central venous catheter in appropriate position. No pneumothorax visualized.  Low lung volumes.  No active lung disease.   Electronically Signed   By: Myles Rosenthal M.D.   On: 2015-06-02 19:56   Dg Chest Port 1 View  06-02-15   CLINICAL DATA:  Altered mental status.  Intubation.  EXAM: PORTABLE CHEST - 1 VIEW  COMPARISON:  Chest x-ray dated 02/22/2010  FINDINGS: Endotracheal tube is in good position. Heart size and pulmonary vascularity are normal. Lungs are clear. No acute osseous abnormality.  IMPRESSION: Endotracheal tube in good position.  No visible acute abnormalities.   Electronically Signed   By: Francene Boyers M.D.   On: 02-Jun-2015 15:48   CODE STATUS:     Code Status Orders        Start     Ordered   05/13/2015  1401  Do not attempt resuscitation (DNR)   Continuous    Question Answer Comment  In the event of cardiac or respiratory ARREST Do not call a "code blue"   In the event of cardiac or respiratory ARREST Do not perform Intubation, CPR, defibrillation or ACLS   In the event of cardiac or respiratory ARREST Use medication by any route, position, wound care, and other measures to relive pain and suffering. May use oxygen, suction and manual treatment of  airway obstruction as needed for comfort.      05/12/2015 1400     Renae Gloss, RICHARD M.D on 05/07/2015 at 3:40 PM  Between 7am to 6pm - Pager - [phone]  After 6pm go to [email or web address] - password [email or web address] [facility]  [place] [facility]  Office  [phone]  CC: Primary care physician; No primary care provider on file.

## 2015-05-30 NOTE — Progress Notes (Signed)
Dr. Sung Amabile at bedside to discuss results from CT of Head and EEG.

## 2015-05-30 NOTE — Progress Notes (Signed)
Pt. Extubated per Physician order

## 2015-05-30 NOTE — Progress Notes (Signed)
Patient with out audible heat tones, blood pressure, heart rate, or respiratory rate.  Patient pronounced by Mellissa Kohut, RN and Charline Bills, RN. Dr. Hilton Sinclair called and notified of time of death.

## 2015-05-30 NOTE — Progress Notes (Signed)
Inpatient Diabetes Program Recommendations  AACE/ADA: New Consensus Statement on Inpatient Glycemic Control (2015)  Target Ranges:  Prepandial:   less than 140 mg/dL      Peak postprandial:   less than 180 mg/dL (1-2 hours)      Critically ill patients:  140 - 180 mg/dL   Patient remains on insulin drip and blood sugars improving.  If patient is being considered for organ donation, recommend keeping him on the insulin drip to maintain euglycemia.  Susette Racer, RN, BA, MHA, CDE Diabetes Coordinator Inpatient Diabetes Program  [phone] (Team Pager) [phone] [facility] Office) 05/09/2015 7:35 AM

## 2015-05-30 NOTE — Progress Notes (Signed)
Spoke with elink concerning pt's blood sugar now being less than 250, 3 in a row.  Per MD, start d5 1/2 normal saline.

## 2015-05-30 DEATH — deceased

## 2015-11-18 IMAGING — CR DG CHEST 1V PORT
1 series · 1 of 1 positions shown · non-contrast
Comparison: 05/14/2015.

CLINICAL DATA: Respiratory failure.

EXAM:
PORTABLE CHEST - 1 VIEW

[portable]
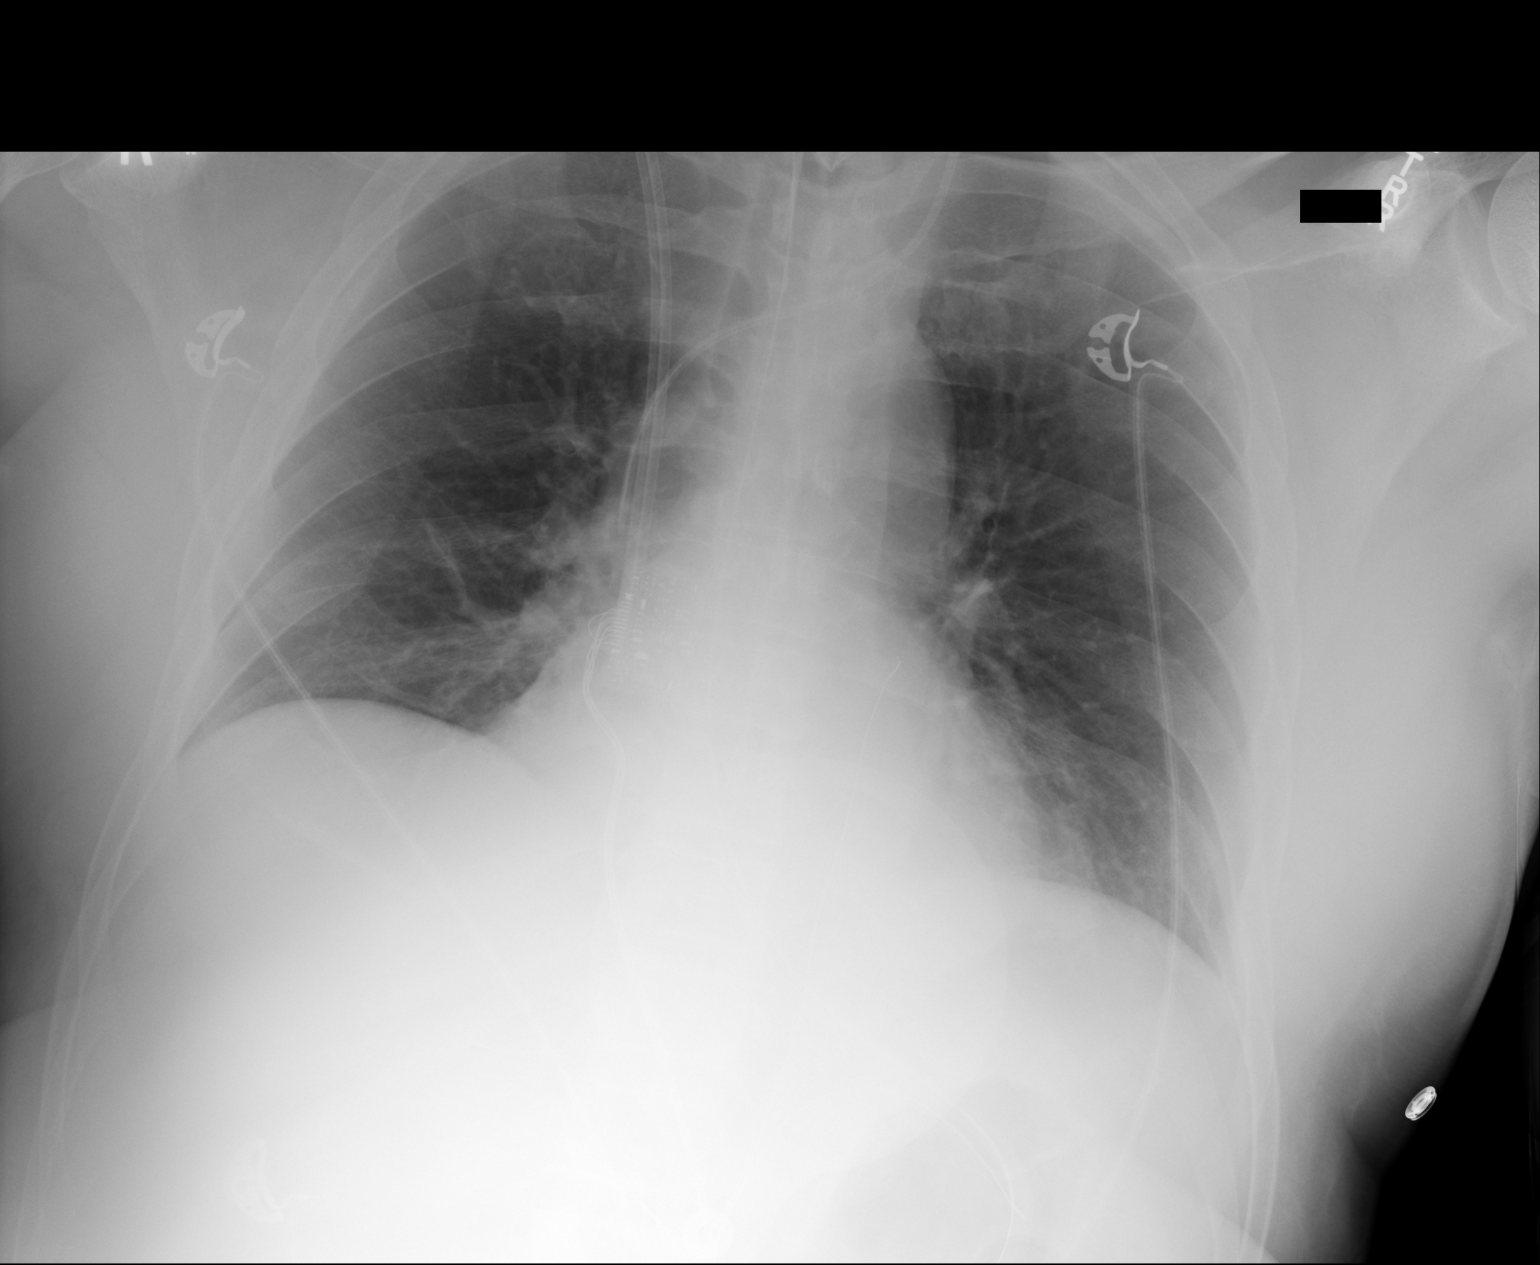

[1 of 1 positions shown; findings below may reference images not displayed]

FINDINGS: Endotracheal tube, NG tube, right IJ line stable position.
Mediastinum hilar structures normal. Heart size stable. Low lung
volumes with mild bibasilar atelectasis. No pleural effusion or
pneumothorax .
IMPRESSION: 1. Lines and tubes in stable position.
2. Low lung volumes with mild bibasilar atelectasis.
